# Patient Record
Sex: Female | Born: 1955
Health system: Southern US, Community
[De-identification: ages and names within clinical notes are randomized; demographics above are authoritative.]

## PROBLEM LIST (undated history)

## (undated) DIAGNOSIS — H269 Unspecified cataract: Secondary | ICD-10-CM

## (undated) DIAGNOSIS — T7840XA Allergy, unspecified, initial encounter: Secondary | ICD-10-CM

## (undated) DIAGNOSIS — F419 Anxiety disorder, unspecified: Secondary | ICD-10-CM

## (undated) HISTORY — DX: Allergy, unspecified, initial encounter: T78.40XA

## (undated) HISTORY — PX: BACK SURGERY: SHX140

## (undated) HISTORY — DX: Unspecified cataract: H26.9

## (undated) HISTORY — DX: Anxiety disorder, unspecified: F41.9

## (undated) HISTORY — PX: OTHER SURGICAL HISTORY: SHX169

---

## 1998-03-09 ENCOUNTER — Other Ambulatory Visit: Admission: RE | Admit: 1998-03-09 | Discharge: 1998-03-09 | Payer: Self-pay | Admitting: Family Medicine

## 1998-07-29 ENCOUNTER — Ambulatory Visit (HOSPITAL_COMMUNITY): Admission: RE | Admit: 1998-07-29 | Discharge: 1998-07-29 | Payer: Self-pay | Admitting: Obstetrics and Gynecology

## 2001-08-14 ENCOUNTER — Other Ambulatory Visit: Admission: RE | Admit: 2001-08-14 | Discharge: 2001-08-14 | Payer: Self-pay | Admitting: Obstetrics and Gynecology

## 2003-08-11 ENCOUNTER — Other Ambulatory Visit: Admission: RE | Admit: 2003-08-11 | Discharge: 2003-08-11 | Payer: Self-pay | Admitting: Obstetrics and Gynecology

## 2005-04-18 ENCOUNTER — Other Ambulatory Visit: Admission: RE | Admit: 2005-04-18 | Discharge: 2005-04-18 | Payer: Self-pay | Admitting: Obstetrics and Gynecology

## 2007-02-14 ENCOUNTER — Ambulatory Visit: Payer: Self-pay | Admitting: Gastroenterology

## 2007-02-28 ENCOUNTER — Encounter (INDEPENDENT_AMBULATORY_CARE_PROVIDER_SITE_OTHER): Payer: Self-pay | Admitting: Specialist

## 2007-02-28 ENCOUNTER — Ambulatory Visit: Payer: Self-pay | Admitting: Gastroenterology

## 2007-04-07 ENCOUNTER — Ambulatory Visit: Payer: Self-pay | Admitting: Gastroenterology

## 2007-05-21 ENCOUNTER — Encounter: Admission: RE | Admit: 2007-05-21 | Discharge: 2007-05-21 | Payer: Self-pay | Admitting: Family Medicine

## 2007-06-07 ENCOUNTER — Encounter: Admission: RE | Admit: 2007-06-07 | Discharge: 2007-06-07 | Payer: Self-pay | Admitting: Family Medicine

## 2010-12-06 ENCOUNTER — Encounter
Admission: RE | Admit: 2010-12-06 | Discharge: 2010-12-06 | Payer: Self-pay | Source: Home / Self Care | Attending: Obstetrics and Gynecology | Admitting: Obstetrics and Gynecology

## 2010-12-14 ENCOUNTER — Encounter
Admission: RE | Admit: 2010-12-14 | Discharge: 2010-12-14 | Payer: Self-pay | Source: Home / Self Care | Attending: Gastroenterology | Admitting: Gastroenterology

## 2011-04-20 NOTE — Assessment & Plan Note (Signed)
[facility]                         GASTROENTEROLOGY OFFICE NOTE   Angie Elliott, Angie Elliott                   MRN:          [id number]  DATE:04/07/2007                            DOB:          01/15/1956    PRIMARY CARE PHYSICIAN:  L. Lupe Carney, M.D.   REASON FOR VISIT:  Intermittent diarrhea.   HISTORY OF PRESENT ILLNESS:  Angie Elliott is a very pleasant 55-year-  old woman whom I met at the time of routine colorectal cancer screening  colonoscopy last month.  At the time of the colonoscopy, during routine  questioning, she admitted to having intermittent bouts of diarrhea that  could last 3-4 weeks at a time, moving her bowels 3-4 times a day during  those bouts.  She will have some cramping at the same time.  The  diarrhea has always been nonbloody.  During her first episode, it lasted  approximately a month or so.  She took Lomotil, and that seemed to help.  More recently, she has been taking Imodium, and that also helps, when  she gets around to finally taking it.  She says these bouts have been  once or twice a year and have been going on for about three years.  She  cannot cite any specific aggravating factor.   Her colonoscopy was essentially normal.  Her terminal ileum was normal.  I did random biopsies of her colonic mucosa, which interestingly showed  slight increase in intraepithelial lymphocytes, which can be  suggestive of microscopic colitis.  Between the episodes of diarrhea,  she has one bowel movement a day.   REVIEW OF SYSTEMS:  Notable for stable weight, otherwise essentially  normal and is available on the nursing intake sheet.   PAST MEDICAL HISTORY:  Tubal ligation 19 years ago.   CURRENT MEDICATIONS:  Birth control pills, Ambien, Benadryl Allergy  medicine.   ALLERGIES:  No known drug allergies.   SOCIAL HISTORY:  Married with two sons.  Lives with her husband.  Drinks  three glasses of wine a month.  Drinks 1-2  teas a day.  No other  caffeine.   FAMILY HISTORY:  No colon cancer or colon polyps are noted.   PHYSICAL EXAMINATION:  VITAL SIGNS:  Height 5 feet 10 inches.  Weight  177 pounds.  Blood pressure 120/76, pulse 76.  CONSTITUTIONAL:  Generally well-appearing.  NEUROLOGIC:  Alert and oriented x3.  HEENT:  Eyes:  Extraocular movements are intact.  Mouth:  Oropharynx  moist.  No lesions.  NECK:  Supple.  No lymphadenopathy.  LUNGS:  Clear to auscultation bilaterally.  CARDIOVASCULAR:  Heart has a regular rate and rhythm.  ABDOMEN:  Soft, nontender, nondistended.  Normal bowel sounds.  EXTREMITIES:  No lower extremity edema.  SKIN:  No rash or lesions on visible extremities.   ASSESSMENT/PLAN:  A 55 year old woman with intermittent diarrhea.  She  may have mild microscopic colitis. Certainly, the random colon biopsies  were somewhat abnormal.  Fortunately, for the most part, she has one  bowel movement a day.  I recommended that during her next episode of  loose stools, to take  Imodium, one pill in the morning and one in the  early afternoon.  If that works, then she should just continue that  until the episode runs it course.  If two Imodium a day do not help, I  instructed her to call my office, and I would try her on either  increased dose of Imodium or more likely, budesonide, which is a poorly  absorbed steroid and showed to work very effectively with microscopic  colitis.  She understands these recommendations and will follow up on an  as-needed basis.     Rachael Fee, MD  Electronically Signed    DPJ/MedQ  DD: 04/07/2007  DT: 04/08/2007  Job #: [id number]   cc:   L. Lupe Carney, M.D.

## 2014-03-03 ENCOUNTER — Other Ambulatory Visit: Payer: Self-pay | Admitting: Obstetrics and Gynecology

## 2014-07-29 ENCOUNTER — Other Ambulatory Visit: Payer: Self-pay | Admitting: Family Medicine

## 2014-07-29 ENCOUNTER — Ambulatory Visit
Admission: RE | Admit: 2014-07-29 | Discharge: 2014-07-29 | Disposition: A | Discharge status: Home / Self Care | Payer: 59 | Source: Ambulatory Visit | Attending: Family Medicine | Admitting: Family Medicine

## 2014-07-29 DIAGNOSIS — M25561 Pain in right knee: Secondary | ICD-10-CM

## 2015-03-11 ENCOUNTER — Other Ambulatory Visit: Payer: Self-pay | Admitting: Obstetrics and Gynecology

## 2015-03-14 LAB — CYTOLOGY - PAP

## 2016-03-15 ENCOUNTER — Other Ambulatory Visit: Payer: Self-pay | Admitting: Obstetrics and Gynecology

## 2016-03-15 DIAGNOSIS — R928 Other abnormal and inconclusive findings on diagnostic imaging of breast: Secondary | ICD-10-CM

## 2016-03-19 ENCOUNTER — Ambulatory Visit
Admission: RE | Admit: 2016-03-19 | Discharge: 2016-03-19 | Disposition: A | Discharge status: Home / Self Care | Payer: Commercial Managed Care - PPO | Source: Ambulatory Visit | Attending: Obstetrics and Gynecology | Admitting: Obstetrics and Gynecology

## 2016-03-19 ENCOUNTER — Other Ambulatory Visit: Payer: Self-pay | Admitting: Obstetrics and Gynecology

## 2016-03-19 DIAGNOSIS — N63 Unspecified lump in unspecified breast: Secondary | ICD-10-CM

## 2016-03-19 DIAGNOSIS — R928 Other abnormal and inconclusive findings on diagnostic imaging of breast: Secondary | ICD-10-CM

## 2016-12-17 ENCOUNTER — Encounter: Payer: Self-pay | Admitting: Gastroenterology

## 2017-04-16 DIAGNOSIS — Z01419 Encounter for gynecological examination (general) (routine) without abnormal findings: Secondary | ICD-10-CM | POA: Diagnosis not present

## 2017-04-16 DIAGNOSIS — Z0000 Encounter for general adult medical examination without abnormal findings: Secondary | ICD-10-CM | POA: Diagnosis not present

## 2017-05-02 DIAGNOSIS — Z1231 Encounter for screening mammogram for malignant neoplasm of breast: Secondary | ICD-10-CM | POA: Diagnosis not present

## 2017-05-08 ENCOUNTER — Encounter: Payer: Self-pay | Admitting: Gastroenterology

## 2017-06-12 DIAGNOSIS — H43811 Vitreous degeneration, right eye: Secondary | ICD-10-CM | POA: Diagnosis not present

## 2017-06-12 DIAGNOSIS — H35051 Retinal neovascularization, unspecified, right eye: Secondary | ICD-10-CM | POA: Diagnosis not present

## 2017-06-19 DIAGNOSIS — H35721 Serous detachment of retinal pigment epithelium, right eye: Secondary | ICD-10-CM | POA: Diagnosis not present

## 2017-06-19 DIAGNOSIS — H3561 Retinal hemorrhage, right eye: Secondary | ICD-10-CM | POA: Diagnosis not present

## 2017-06-19 DIAGNOSIS — H353211 Exudative age-related macular degeneration, right eye, with active choroidal neovascularization: Secondary | ICD-10-CM | POA: Diagnosis not present

## 2017-06-19 DIAGNOSIS — H2511 Age-related nuclear cataract, right eye: Secondary | ICD-10-CM | POA: Diagnosis not present

## 2017-06-20 ENCOUNTER — Other Ambulatory Visit (HOSPITAL_COMMUNITY): Payer: Self-pay | Admitting: Ophthalmology

## 2017-06-20 DIAGNOSIS — H35011 Changes in retinal vascular appearance, right eye: Secondary | ICD-10-CM | POA: Diagnosis not present

## 2017-06-20 DIAGNOSIS — H35731 Hemorrhagic detachment of retinal pigment epithelium, right eye: Secondary | ICD-10-CM | POA: Diagnosis not present

## 2017-06-20 DIAGNOSIS — H3561 Retinal hemorrhage, right eye: Secondary | ICD-10-CM | POA: Diagnosis not present

## 2017-06-20 DIAGNOSIS — R51 Headache: Secondary | ICD-10-CM

## 2017-06-20 DIAGNOSIS — R519 Headache, unspecified: Secondary | ICD-10-CM

## 2017-06-20 DIAGNOSIS — G44021 Chronic cluster headache, intractable: Secondary | ICD-10-CM | POA: Diagnosis not present

## 2017-06-21 DIAGNOSIS — H3560 Retinal hemorrhage, unspecified eye: Secondary | ICD-10-CM | POA: Diagnosis not present

## 2017-06-24 ENCOUNTER — Encounter (HOSPITAL_COMMUNITY): Payer: Self-pay

## 2017-06-24 ENCOUNTER — Ambulatory Visit (HOSPITAL_COMMUNITY)
Admission: RE | Admit: 2017-06-24 | Discharge: 2017-06-24 | Disposition: A | Discharge status: Home / Self Care | Payer: Commercial Managed Care - PPO | Source: Ambulatory Visit | Attending: Ophthalmology | Admitting: Ophthalmology

## 2017-06-24 DIAGNOSIS — H532 Diplopia: Secondary | ICD-10-CM | POA: Diagnosis not present

## 2017-06-24 DIAGNOSIS — R51 Headache: Secondary | ICD-10-CM | POA: Diagnosis not present

## 2017-06-24 DIAGNOSIS — H35011 Changes in retinal vascular appearance, right eye: Secondary | ICD-10-CM

## 2017-06-24 DIAGNOSIS — R519 Headache, unspecified: Secondary | ICD-10-CM

## 2017-06-24 DIAGNOSIS — H538 Other visual disturbances: Secondary | ICD-10-CM | POA: Diagnosis not present

## 2017-06-24 LAB — POCT I-STAT CREATININE: Creatinine, Ser: 0.9 mg/dL (ref 0.44–1.00)

## 2017-06-24 MED ORDER — IOPAMIDOL (ISOVUE-300) INJECTION 61%
INTRAVENOUS | Status: AC
  Administered 2017-06-24: 100 mL
  Filled 2017-06-24: qty 100

## 2017-06-28 ENCOUNTER — Ambulatory Visit (AMBULATORY_SURGERY_CENTER): Payer: Self-pay

## 2017-06-28 VITALS — Ht 70.0 in | Wt 160.0 lb

## 2017-06-28 DIAGNOSIS — Z1211 Encounter for screening for malignant neoplasm of colon: Secondary | ICD-10-CM

## 2017-06-28 MED ORDER — NA SULFATE-K SULFATE-MG SULF 17.5-3.13-1.6 GM/177ML PO SOLN: 1.0000 | Freq: Once | ORAL | 0 refills | Status: AC

## 2017-06-28 NOTE — Progress Notes (Signed)
Denies allergies to eggs or soy products. Denies complication of anesthesia or sedation. Denies use of weight loss medication. Denies use of O2.   Emmi instructions declined.  

## 2017-07-02 ENCOUNTER — Encounter: Payer: Self-pay | Admitting: Gastroenterology

## 2017-07-05 DIAGNOSIS — H3561 Retinal hemorrhage, right eye: Secondary | ICD-10-CM | POA: Diagnosis not present

## 2017-07-05 DIAGNOSIS — H35059 Retinal neovascularization, unspecified, unspecified eye: Secondary | ICD-10-CM | POA: Diagnosis not present

## 2017-07-05 DIAGNOSIS — H2513 Age-related nuclear cataract, bilateral: Secondary | ICD-10-CM | POA: Diagnosis not present

## 2017-07-06 DIAGNOSIS — H3561 Retinal hemorrhage, right eye: Secondary | ICD-10-CM | POA: Insufficient documentation

## 2017-07-06 DIAGNOSIS — H318 Other specified disorders of choroid: Secondary | ICD-10-CM | POA: Insufficient documentation

## 2017-07-06 DIAGNOSIS — H2513 Age-related nuclear cataract, bilateral: Secondary | ICD-10-CM | POA: Insufficient documentation

## 2017-07-12 ENCOUNTER — Encounter: Payer: Self-pay | Admitting: Gastroenterology

## 2017-07-12 ENCOUNTER — Ambulatory Visit (AMBULATORY_SURGERY_CENTER): Payer: Commercial Managed Care - PPO | Admitting: Gastroenterology

## 2017-07-12 VITALS — BP 116/63 | HR 73 | Temp 98.2°F | Resp 18 | Ht 70.0 in | Wt 160.0 lb

## 2017-07-12 DIAGNOSIS — Z1211 Encounter for screening for malignant neoplasm of colon: Secondary | ICD-10-CM | POA: Diagnosis not present

## 2017-07-12 DIAGNOSIS — Z1212 Encounter for screening for malignant neoplasm of rectum: Secondary | ICD-10-CM

## 2017-07-12 MED ORDER — SODIUM CHLORIDE 0.9 % IV SOLN: 500.0000 mL | INTRAVENOUS | Status: DC

## 2017-07-12 NOTE — Progress Notes (Signed)
A and O x3. Report to RN. Tolerated MAC anesthesia well.

## 2017-07-12 NOTE — Patient Instructions (Signed)
YOU HAD AN ENDOSCOPIC PROCEDURE TODAY AT [facility]:   Refer to the procedure report that was given to you for any specific questions about what was found during the examination.  If the procedure report does not answer your questions, please call your gastroenterologist to clarify.  If you requested that your care partner not be given the details of your procedure findings, then the procedure report has been included in a sealed envelope for you to review at your convenience later.  YOU SHOULD EXPECT: Some feelings of bloating in the abdomen. Passage of more gas than usual.  Walking can help get rid of the air that was put into your GI tract during the procedure and reduce the bloating. If you had a lower endoscopy (such as a colonoscopy or flexible sigmoidoscopy) you may notice spotting of blood in your stool or on the toilet paper. If you underwent a bowel prep for your procedure, you may not have a normal bowel movement for a few days.  Please Note:  You might notice some irritation and congestion in your nose or some drainage.  This is from the oxygen used during your procedure.  There is no need for concern and it should clear up in a day or so.  SYMPTOMS TO REPORT IMMEDIATELY:   Following lower endoscopy (colonoscopy or flexible sigmoidoscopy):  Excessive amounts of blood in the stool  Significant tenderness or worsening of abdominal pains  Swelling of the abdomen that is new, acute  Fever of 100F or higher  For urgent or emergent issues, a gastroenterologist can be reached at any hour by calling [phone].   DIET:  We do recommend a small meal at first, but then you may proceed to your regular diet.  Drink plenty of fluids but you should avoid alcoholic beverages for 24 hours.  ACTIVITY:  You should plan to take it easy for the rest of today and you should NOT DRIVE or use heavy machinery until tomorrow (because of the sedation medicines used during the test).     FOLLOW UP: Our staff will call the number listed on your records the next business day following your procedure to check on you and address any questions or concerns that you may have regarding the information given to you following your procedure. If we do not reach you, we will leave a message.  However, if you are feeling well and you are not experiencing any problems, there is no need to return our call.  We will assume that you have returned to your regular daily activities without incident.  If any biopsies were taken you will be contacted by phone or by letter within the next 1-3 weeks.  Please call us at [phone] if you have not heard about the biopsies in 3 weeks.   Repeat Colonoscopy in 10 years   SIGNATURES/CONFIDENTIALITY: You and/or your care partner have signed paperwork which will be entered into your electronic medical record.  These signatures attest to the fact that that the information above on your After Visit Summary has been reviewed and is understood.  Full responsibility of the confidentiality of this discharge information lies with you and/or your care-partner.

## 2017-07-12 NOTE — Progress Notes (Signed)
Pt's states no medical or surgical changes since previsit or office visit. 

## 2017-07-12 NOTE — Op Note (Signed)
[place] Patient Name: Angie Elliott Procedure Date: 07/12/2017 9:55 AM MRN: [id number] Endoscopist: Milus Banister , MD Age: 61 Referring MD:  Date of Birth: [birth year] Gender: Female Account #: [id number] Procedure:                Colonoscopy Indications:              Screening for colorectal malignant neoplasm Medicines:                Monitored Anesthesia Care Procedure:                Pre-Anesthesia Assessment:                           - Prior to the procedure, a History and Physical                            was performed, and patient medications and                            allergies were reviewed. The patient's tolerance of                            previous anesthesia was also reviewed. The risks                            and benefits of the procedure and the sedation                            options and risks were discussed with the patient.                            All questions were answered, and informed consent                            was obtained. Prior Anticoagulants: The patient has                            taken no previous anticoagulant or antiplatelet                            agents. ASA Grade Assessment: II - A patient with                            mild systemic disease. After reviewing the risks                            and benefits, the patient was deemed in                            satisfactory condition to undergo the procedure.                           After obtaining informed consent, the colonoscope  was passed under direct vision. Throughout the                            procedure, the patient's blood pressure, pulse, and                            oxygen saturations were monitored continuously. The                            Model CF-HQ190L [id number]) scope was introduced                            through the anus and advanced to the the cecum,                            identified by  appendiceal orifice and ileocecal                            valve. The colonoscopy was performed without                            difficulty. The patient tolerated the procedure                            well. The quality of the bowel preparation was                            excellent. The ileocecal valve, appendiceal                            orifice, and rectum were photographed. Scope In: 10:08:28 AM Scope Out: 10:18:45 AM Scope Withdrawal Time: 0 hours 6 minutes 24 seconds  Total Procedure Duration: 0 hours 10 minutes 17 seconds  Findings:                 The entire examined colon appeared normal on direct                            and retroflexion views. Complications:            No immediate complications. Estimated blood loss:                            None. Estimated Blood Loss:     Estimated blood loss: none. Impression:               - The entire examined colon is normal on direct and                            retroflexion views.                           - No polyps or cancers. Recommendation:           - Patient has a contact number available for  emergencies. The signs and symptoms of potential                            delayed complications were discussed with the                            patient. Return to normal activities tomorrow.                            Written discharge instructions were provided to the                            patient.                           - Resume previous diet.                           - Continue present medications.                           - Repeat colonoscopy in 10 years for screening. Milus Banister, MD 07/12/2017 10:20:26 AM This report has been signed electronically.

## 2017-07-15 ENCOUNTER — Telehealth: Payer: Self-pay | Admitting: *Deleted

## 2017-07-15 NOTE — Telephone Encounter (Signed)
  Follow up Call-  Call back number 07/12/2017  Post procedure Call Back phone  # [phone]  Permission to leave phone message Yes  Some recent data might be hidden     Patient questions:  Do you have a fever, pain , or abdominal swelling? No. Pain Score  0 *  Have you tolerated food without any problems? Yes.    Have you been able to return to your normal activities? Yes.    Do you have any questions about your discharge instructions: Diet   No. Medications  No. Follow up visit  No.  Do you have questions or concerns about your Care? No.  Actions: * If pain score is 4 or above: No action needed, pain <4.

## 2017-08-06 DIAGNOSIS — H3561 Retinal hemorrhage, right eye: Secondary | ICD-10-CM | POA: Diagnosis not present

## 2017-08-06 DIAGNOSIS — H35051 Retinal neovascularization, unspecified, right eye: Secondary | ICD-10-CM | POA: Diagnosis not present

## 2017-08-06 DIAGNOSIS — H35059 Retinal neovascularization, unspecified, unspecified eye: Secondary | ICD-10-CM | POA: Diagnosis not present

## 2017-08-23 DIAGNOSIS — R300 Dysuria: Secondary | ICD-10-CM | POA: Diagnosis not present

## 2017-08-23 DIAGNOSIS — Z23 Encounter for immunization: Secondary | ICD-10-CM | POA: Diagnosis not present

## 2017-08-29 DIAGNOSIS — R300 Dysuria: Secondary | ICD-10-CM | POA: Diagnosis not present

## 2017-08-29 DIAGNOSIS — R8290 Unspecified abnormal findings in urine: Secondary | ICD-10-CM | POA: Diagnosis not present

## 2017-09-03 DIAGNOSIS — H3561 Retinal hemorrhage, right eye: Secondary | ICD-10-CM | POA: Diagnosis not present

## 2017-09-03 DIAGNOSIS — H318 Other specified disorders of choroid: Secondary | ICD-10-CM | POA: Diagnosis not present

## 2017-10-04 DIAGNOSIS — H3561 Retinal hemorrhage, right eye: Secondary | ICD-10-CM | POA: Diagnosis not present

## 2017-10-04 DIAGNOSIS — H318 Other specified disorders of choroid: Secondary | ICD-10-CM | POA: Diagnosis not present

## 2017-10-04 DIAGNOSIS — H35051 Retinal neovascularization, unspecified, right eye: Secondary | ICD-10-CM | POA: Diagnosis not present

## 2017-11-15 DIAGNOSIS — H3561 Retinal hemorrhage, right eye: Secondary | ICD-10-CM | POA: Diagnosis not present

## 2017-11-15 DIAGNOSIS — H318 Other specified disorders of choroid: Secondary | ICD-10-CM | POA: Diagnosis not present

## 2017-12-27 DIAGNOSIS — H318 Other specified disorders of choroid: Secondary | ICD-10-CM | POA: Diagnosis not present

## 2018-02-21 DIAGNOSIS — H318 Other specified disorders of choroid: Secondary | ICD-10-CM | POA: Diagnosis not present

## 2018-02-21 DIAGNOSIS — H3561 Retinal hemorrhage, right eye: Secondary | ICD-10-CM | POA: Diagnosis not present

## 2018-02-21 DIAGNOSIS — H2513 Age-related nuclear cataract, bilateral: Secondary | ICD-10-CM | POA: Diagnosis not present

## 2018-03-14 DIAGNOSIS — R300 Dysuria: Secondary | ICD-10-CM | POA: Diagnosis not present

## 2018-03-14 DIAGNOSIS — N3000 Acute cystitis without hematuria: Secondary | ICD-10-CM | POA: Diagnosis not present

## 2018-04-29 ENCOUNTER — Other Ambulatory Visit: Payer: Self-pay

## 2018-04-29 ENCOUNTER — Ambulatory Visit (INDEPENDENT_AMBULATORY_CARE_PROVIDER_SITE_OTHER): Payer: Commercial Managed Care - PPO | Admitting: Podiatry

## 2018-04-29 ENCOUNTER — Encounter: Payer: Self-pay | Admitting: Podiatry

## 2018-04-29 ENCOUNTER — Ambulatory Visit (INDEPENDENT_AMBULATORY_CARE_PROVIDER_SITE_OTHER): Payer: Commercial Managed Care - PPO

## 2018-04-29 DIAGNOSIS — S92415A Nondisplaced fracture of proximal phalanx of left great toe, initial encounter for closed fracture: Secondary | ICD-10-CM

## 2018-04-29 DIAGNOSIS — M779 Enthesopathy, unspecified: Secondary | ICD-10-CM

## 2018-04-29 DIAGNOSIS — M778 Other enthesopathies, not elsewhere classified: Secondary | ICD-10-CM

## 2018-04-29 NOTE — Progress Notes (Signed)
Subjective:  Patient ID: Angie Elliott, female    DOB: 1955-12-22,  MRN: [id number] HPI Chief Complaint  Patient presents with  . Toe Pain    left foot great toe; pt stated, "stepped over ottoman on saturday night, got big toe stuck in pajamas and fell, toe bent back and been sore and tender to touch every since; hard to move all toes, been icing and taking tylenol for pain; feels warm when i touch it and gets sharp pain; pain is usally (8/10) when walking"  . Foot Pain    left foot bottom of heel; pt stated, "would like heel looked at, stepped on rock about 2-3 wks ago and spot is still little sore and painful to walk on; have plantar fasciitis in both feet"    62 y.o. female presents with the above complaint.   ROS: Denies fever chills nausea vomiting muscle aches pains calf pain chest pain back pain shortness of breath.  Past Medical History:  Diagnosis Date  . Allergy   . Anxiety   . Cataract    Past Surgical History:  Procedure Laterality Date  . BACK SURGERY N/A   . knee surgery x 2 Bilateral     Current Outpatient Medications:  .  acetaminophen (TYLENOL) 500 MG tablet, Take 500 mg by mouth every 6 (six) hours as needed., Disp: , Rfl:  .  OVER THE COUNTER MEDICATION, Fiber Gummies 4 g  Two tablets daily., Disp: , Rfl:  .  sulfamethoxazole-trimethoprim (BACTRIM DS,SEPTRA DS) 800-160 MG tablet, , Disp: , Rfl: 0  Current Facility-Administered Medications:  .  0.9 %  sodium chloride infusion, 500 mL, Intravenous, Continuous, Milus Banister, MD  No Known Allergies Review of Systems Objective:  There were no vitals filed for this visit.  General: Well developed, nourished, in no acute distress, alert and oriented x3   Dermatological: Skin is warm, dry and supple bilateral. Nails x 10 are well maintained; remaining integument appears unremarkable at this time. There are no open sores, no preulcerative lesions, no rash or signs of infection present.  Vascular:  Dorsalis Pedis artery and Posterior Tibial artery pedal pulses are 2/4 bilateral with immedate capillary fill time. Pedal hair growth present. No varicosities and no lower extremity edema present bilateral.   Neruologic: Grossly intact via light touch bilateral. Vibratory intact via tuning fork bilateral. Protective threshold with Semmes Wienstein monofilament intact to all pedal sites bilateral. Patellar and Achilles deep tendon reflexes 2+ bilateral. No Babinski or clonus noted bilateral.   Musculoskeletal: No gross boney pedal deformities bilateral. No pain, crepitus, or limitation noted with foot and ankle range of motion bilateral. Muscular strength 5/5 in all groups tested bilateral.  Dislocated painful edematous distal portion of the proximal phalanx hallux left exquisitely tender on palpation extensors and flexors appear to be intact.  Gait: Unassisted, Nonantalgic.    Radiographs:  Radiographs taken today demonstrates an osseously mature individual soft tissue increase in density plantar fascial cranial insertion site consistent with plantar fasciitis acute findings also demonstrate a fracture of the distalmost aspect of the proximal phalanx dorsally dislocated laterally dislocated does not appear to be fracture through the articular surface.  Assessment & Plan:    Assessment: Closed dislocated fracture of the distal phalanx at the level of the hallux interphalangeal joint.  Plan: Discussed etiology pathology conservative versus surgical therapies.  At this point we consented her for a closed reduction possible open reduction with pinning of the toe.  She understands this and is  amenable to it.  Discussed the possible postop complications which may include but are not limited to postop pain bleeding swelling infection need for further surgery osteoarthritis failure of the procedure to alleviate symptoms also digit loss of limb loss of life.  Dispensed a Cam walker for her to start wearing  immediately.     Max T. Mayo, [place]

## 2018-04-29 NOTE — Patient Instructions (Signed)
Pre-Operative Instructions  Congratulations, you have decided to take an important step towards improving your quality of life.  You can be assured that the doctors and staff at [facility] will be with you every step of the way.  Here are some important things you should know:  1. Plan to be at the surgery center/hospital at least 1 (one) hour prior to your scheduled time, unless otherwise directed by the surgical center/hospital staff.  You must have a responsible adult accompany you, remain during the surgery and drive you home.  Make sure you have directions to the surgical center/hospital to ensure you arrive on time. 2. If you are having surgery at [facility] or [facility], you will need a copy of your medical history and physical form from your family physician within one month prior to the date of surgery. We will give you a form for your primary physician to complete.  3. We make every effort to accommodate the date you request for surgery.  However, there are times where surgery dates or times have to be moved.  We will contact you as soon as possible if a change in schedule is required.   4. No aspirin/ibuprofen for one week before surgery.  If you are on aspirin, any non-steroidal anti-inflammatory medications (Mobic, Aleve, Ibuprofen) should not be taken seven (7) days prior to your surgery.  You make take Tylenol for pain prior to surgery.  5. Medications - If you are taking daily heart and blood pressure medications, seizure, reflux, allergy, asthma, anxiety, pain or diabetes medications, make sure you notify the surgery center/hospital before the day of surgery so they can tell you which medications you should take or avoid the day of surgery. 6. No food or drink after midnight the night before surgery unless directed otherwise by surgical center/hospital staff. 7. No alcoholic beverages 24-hours prior to surgery.  No smoking 24-hours prior or 24-hours after  surgery. 8. Wear loose pants or shorts. They should be loose enough to fit over bandages, boots, and casts. 9. Don't wear slip-on shoes. Sneakers are preferred. 10. Bring your boot with you to the surgery center/hospital.  Also bring crutches or a walker if your physician has prescribed it for you.  If you do not have this equipment, it will be provided for you after surgery. 11. If you have not been contacted by the surgery center/hospital by the day before your surgery, call to confirm the date and time of your surgery. 12. Leave-time from work may vary depending on the type of surgery you have.  Appropriate arrangements should be made prior to surgery with your employer. 13. Prescriptions will be provided immediately following surgery by your doctor.  Fill these as soon as possible after surgery and take the medication as directed. Pain medications will not be refilled on weekends and must be approved by the doctor. 14. Remove nail polish on the operative foot and avoid getting pedicures prior to surgery. 15. Wash the night before surgery.  The night before surgery wash the foot and leg well with water and the antibacterial soap provided. Be sure to pay special attention to beneath the toenails and in between the toes.  Wash for at least three (3) minutes. Rinse thoroughly with water and dry well with a towel.  Perform this wash unless told not to do so by your physician.  Enclosed: 1 Ice pack (please put in freezer the night before surgery)   1 Hibiclens skin cleaner     Pre-op instructions  If you have any questions regarding the instructions, please do not hesitate to call our office.  [place]: [address], [place], [place] [address] -- [phone]  [place]: [address] [place], [place] [address] -- [phone]  [place]: [address]  [place], [place] [address] -- [phone]  [place]: [address], [address], [place], [place] [address] -- [phone]  Website:  [email or web address] 

## 2018-05-02 ENCOUNTER — Encounter: Payer: Self-pay | Admitting: *Deleted

## 2018-05-02 ENCOUNTER — Telehealth: Payer: Self-pay | Admitting: Podiatry

## 2018-05-02 DIAGNOSIS — H318 Other specified disorders of choroid: Secondary | ICD-10-CM | POA: Diagnosis not present

## 2018-05-02 NOTE — Telephone Encounter (Signed)
Paitent was told by Dr Milinda Pointer he could write her a note to get out of jury duty on 05/06/18. If so if you can email it to her this is her email below If you can call patient back at [phone]    [email or web address]

## 2018-05-02 NOTE — Telephone Encounter (Signed)
Letter emailed to pt by Bethann Humble - Record coordinator.

## 2018-05-04 NOTE — Telephone Encounter (Signed)
That is fine.  They may still not let her out of jury duty.

## 2018-05-07 ENCOUNTER — Other Ambulatory Visit: Payer: Self-pay | Admitting: Podiatry

## 2018-05-07 MED ORDER — OXYCODONE-ACETAMINOPHEN 10-325 MG PO TABS: 1.0000 | ORAL_TABLET | Freq: Four times a day (QID) | ORAL | 0 refills | Status: AC | PRN

## 2018-05-07 MED ORDER — ONDANSETRON HCL 4 MG PO TABS: 4.0000 mg | ORAL_TABLET | Freq: Three times a day (TID) | ORAL | 0 refills | Status: DC | PRN

## 2018-05-07 MED ORDER — CEPHALEXIN 500 MG PO CAPS: 500.0000 mg | ORAL_CAPSULE | Freq: Three times a day (TID) | ORAL | 0 refills | Status: DC

## 2018-05-08 HISTORY — PX: DG GREAT TOE LEFT FOOT: HXRAD1656

## 2018-05-09 ENCOUNTER — Encounter: Payer: Self-pay | Admitting: Podiatry

## 2018-05-09 ENCOUNTER — Telehealth: Payer: Self-pay | Admitting: Podiatry

## 2018-05-09 DIAGNOSIS — S92412A Displaced fracture of proximal phalanx of left great toe, initial encounter for closed fracture: Secondary | ICD-10-CM | POA: Diagnosis not present

## 2018-05-09 DIAGNOSIS — S92415A Nondisplaced fracture of proximal phalanx of left great toe, initial encounter for closed fracture: Secondary | ICD-10-CM | POA: Diagnosis not present

## 2018-05-09 DIAGNOSIS — M545 Low back pain: Secondary | ICD-10-CM | POA: Diagnosis not present

## 2018-05-09 DIAGNOSIS — M25572 Pain in left ankle and joints of left foot: Secondary | ICD-10-CM | POA: Diagnosis not present

## 2018-05-09 DIAGNOSIS — M722 Plantar fascial fibromatosis: Secondary | ICD-10-CM | POA: Diagnosis not present

## 2018-05-09 DIAGNOSIS — S92412B Displaced fracture of proximal phalanx of left great toe, initial encounter for open fracture: Secondary | ICD-10-CM | POA: Diagnosis not present

## 2018-05-09 NOTE — Telephone Encounter (Signed)
This is Alise with [employer] on [address]. Dr. Milinda Pointer wrote a prescription for the percocet 10-325 with directions right now, it would be 60 morphine milligram equivalent and usually on the first fill we can only fill 50 morphine milligram equivalents or less. We just wondered if we could put on there max of 3 tablets per day. Our phone number is [phone]. Thanks.

## 2018-05-11 ENCOUNTER — Emergency Department (HOSPITAL_COMMUNITY): Payer: Commercial Managed Care - PPO

## 2018-05-11 ENCOUNTER — Other Ambulatory Visit: Payer: Self-pay

## 2018-05-11 ENCOUNTER — Encounter (HOSPITAL_COMMUNITY): Payer: Self-pay | Admitting: Emergency Medicine

## 2018-05-11 ENCOUNTER — Inpatient Hospital Stay (HOSPITAL_COMMUNITY)
Admission: EM | Admit: 2018-05-11 | Discharge: 2018-05-12 | DRG: 282 | Disposition: A | Discharge status: Home / Self Care | Payer: Commercial Managed Care - PPO | Attending: Cardiology | Admitting: Cardiology

## 2018-05-11 DIAGNOSIS — I214 Non-ST elevation (NSTEMI) myocardial infarction: Secondary | ICD-10-CM | POA: Diagnosis not present

## 2018-05-11 DIAGNOSIS — I361 Nonrheumatic tricuspid (valve) insufficiency: Secondary | ICD-10-CM | POA: Diagnosis not present

## 2018-05-11 DIAGNOSIS — I351 Nonrheumatic aortic (valve) insufficiency: Secondary | ICD-10-CM | POA: Diagnosis not present

## 2018-05-11 DIAGNOSIS — N959 Unspecified menopausal and perimenopausal disorder: Secondary | ICD-10-CM | POA: Diagnosis present

## 2018-05-11 DIAGNOSIS — R079 Chest pain, unspecified: Secondary | ICD-10-CM | POA: Diagnosis not present

## 2018-05-11 DIAGNOSIS — R778 Other specified abnormalities of plasma proteins: Secondary | ICD-10-CM

## 2018-05-11 DIAGNOSIS — I200 Unstable angina: Secondary | ICD-10-CM | POA: Diagnosis present

## 2018-05-11 DIAGNOSIS — Z79899 Other long term (current) drug therapy: Secondary | ICD-10-CM

## 2018-05-11 DIAGNOSIS — S92402D Displaced unspecified fracture of left great toe, subsequent encounter for fracture with routine healing: Secondary | ICD-10-CM

## 2018-05-11 DIAGNOSIS — Z8249 Family history of ischemic heart disease and other diseases of the circulatory system: Secondary | ICD-10-CM

## 2018-05-11 DIAGNOSIS — R7989 Other specified abnormal findings of blood chemistry: Secondary | ICD-10-CM

## 2018-05-11 LAB — CBC
HEMATOCRIT: 40.4 % (ref 36.0–46.0)
HEMOGLOBIN: 13.1 g/dL (ref 12.0–15.0)
MCH: 29.4 pg (ref 26.0–34.0)
MCHC: 32.4 g/dL (ref 30.0–36.0)
MCV: 90.8 fL (ref 78.0–100.0)
Platelets: 179 10*3/uL (ref 150–400)
RBC: 4.45 MIL/uL (ref 3.87–5.11)
RDW: 13.4 % (ref 11.5–15.5)
WBC: 6.3 10*3/uL (ref 4.0–10.5)

## 2018-05-11 LAB — I-STAT TROPONIN, ED: Troponin i, poc: 1.27 ng/mL (ref 0.00–0.08)

## 2018-05-11 LAB — BASIC METABOLIC PANEL
ANION GAP: 8 (ref 5–15)
BUN: 15 mg/dL (ref 6–20)
CO2: 26 mmol/L (ref 22–32)
Calcium: 9.2 mg/dL (ref 8.9–10.3)
Chloride: 102 mmol/L (ref 101–111)
Creatinine, Ser: 0.9 mg/dL (ref 0.44–1.00)
GFR calc Af Amer: >60 mL/min (ref >60)
GFR calc non Af Amer: >60 mL/min (ref >60)
GLUCOSE: 134 mg/dL — AB (ref 65–99)
POTASSIUM: 4.7 mmol/L (ref 3.5–5.1)
Sodium: 136 mmol/L (ref 135–145)

## 2018-05-11 MED ORDER — HEPARIN BOLUS VIA INFUSION
4000.0000 [IU] | Freq: Once | INTRAVENOUS | Status: AC
  Administered 2018-05-12: 4000 [IU] via INTRAVENOUS
  Filled 2018-05-11: qty 4000

## 2018-05-11 MED ORDER — ASPIRIN EC 325 MG PO TBEC
325.0000 mg | DELAYED_RELEASE_TABLET | Freq: Once | ORAL | Status: AC
  Administered 2018-05-11: 325 mg via ORAL
  Filled 2018-05-11: qty 1

## 2018-05-11 MED ORDER — IOPAMIDOL (ISOVUE-370) INJECTION 76%
100.0000 mL | Freq: Once | INTRAVENOUS | Status: AC | PRN
  Administered 2018-05-11: 100 mL via INTRAVENOUS

## 2018-05-11 MED ORDER — IOPAMIDOL (ISOVUE-370) INJECTION 76%
INTRAVENOUS | Status: AC
  Filled 2018-05-11: qty 100

## 2018-05-11 MED ORDER — HEPARIN (PORCINE) IN NACL 100-0.45 UNIT/ML-% IJ SOLN
900.0000 [IU]/h | INTRAMUSCULAR | Status: DC
  Administered 2018-05-12: 900 [IU]/h via INTRAVENOUS
  Filled 2018-05-11: qty 250

## 2018-05-11 NOTE — ED Provider Notes (Addendum)
[facility] EMERGENCY DEPARTMENT Provider Note   CSN: [id number] Arrival date & time: 05/11/18  2002     History   Chief Complaint Chief Complaint  Patient presents with  . Chest Pain    HPI Angie Elliott is a 62 y.o. female with history of anxiety, recent left foot surgery who presents after an episode of chest pain, nausea, and fever chills.  Patient reports her symptoms began after taking Percocet, however she has been taking the Percocet for the past 3 days without this reaction.  She reports her chest pain has improved, however she states that she still has a dull ache on the left side of her chest.  It does not radiate.  She denies any shortness of breath or pleuritic symptoms.  She denies any diaphoresis.  She has no history of cardiac disease.  She denies any new leg pain or swelling.  She reports no change in her foot pain.  She reports she only has pain when her pain medication wears off.  She denies any calf pain or swelling.  She denies any documented fevers at home.  HPI  Past Medical History:  Diagnosis Date  . Allergy   . Anxiety   . Cataract     Patient Active Problem List   Diagnosis Date Noted  . NSTEMI (non-ST elevated myocardial infarction) ([place])   . Age-related nuclear cataract of both eyes 07/06/2017  . 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 Macular subretinal hemorrhage of right eye 07/06/2017    Past Surgical History:  Procedure Laterality Date  . BACK SURGERY N/A   . DG GREAT TOE LEFT FOOT  05/08/2018   pin put in   . knee surgery x 2 Bilateral      OB History   None      Home Medications    Prior to Admission medications   Medication Sig Start Date End Date Taking? Authorizing Provider  acetaminophen (TYLENOL) 500 MG tablet Take 500 mg by mouth every 6 (six) hours as needed for mild pain.    Yes [provider]  cephALEXin (KEFLEX) 500 MG capsule Take 1 capsule (500 mg total) by mouth 3  (three) times daily. 05/07/18  Yes Hyatt, Max T, DPM  docusate sodium (COLACE) 100 MG capsule Take 100-300 mg by mouth daily as needed for mild constipation.   Yes [provider]  ibuprofen (ADVIL,MOTRIN) 800 MG tablet Take 800 mg by mouth every 8 (eight) hours as needed for moderate pain.   Yes [provider]  ondansetron (ZOFRAN) 4 MG tablet Take 1 tablet (4 mg total) by mouth every 8 (eight) hours as needed for nausea or vomiting. 05/07/18  Yes Hyatt, Max T, DPM  oxyCODONE-acetaminophen (PERCOCET) 10-325 MG tablet Take 1 tablet by mouth every 6 (six) hours as needed for up to 7 days for pain. 05/07/18 05/14/18 Yes Hyatt, Max T, DPM    Family History Family History  Problem Relation Age of Onset  . Pancreatic cancer Mother   . Heart failure Father        valvular heart disease  . CAD Father        s/p CABG in 47's  . Colon cancer Neg Hx   . Esophageal cancer Neg Hx   . Rectal cancer Neg Hx   . Stomach cancer Neg Hx     Social History Social History   Tobacco Use  . Smoking status: Never Smoker  . Smokeless tobacco: Never Used  Substance Use Topics  . Alcohol use: Yes    Comment: occasional beer.   . Drug use: No     Allergies   Patient has no known allergies.   Review of Systems Review of Systems  Constitutional: Positive for chills and fever (subjective). Negative for diaphoresis.  HENT: Negative for facial swelling and sore throat.   Respiratory: Negative for shortness of breath.   Cardiovascular: Positive for chest pain.  Gastrointestinal: Positive for nausea. Negative for abdominal pain and vomiting.  Genitourinary: Negative for dysuria.  Musculoskeletal: Negative for back pain.  Skin: Positive for wound. Negative for rash.  Neurological: Negative for headaches.  Psychiatric/Behavioral: The patient is not nervous/anxious.      Physical Exam Updated Vital Signs BP 123/68   Pulse 65   Temp 97.6 F (36.4 C) (Oral)   Resp 13   Ht 5\' 10"   (1.778 m)   Wt 76.7 kg (169 lb)   SpO2 98%   BMI 24.25 kg/m   Physical Exam  Constitutional: She appears well-developed and well-nourished. No distress.  HENT:  Head: Normocephalic and atraumatic.  Mouth/Throat: Oropharynx is clear and moist. No oropharyngeal exudate.  Eyes: Pupils are equal, round, and reactive to light. Conjunctivae are normal. Right eye exhibits no discharge. Left eye exhibits no discharge. No scleral icterus.  Neck: Normal range of motion. Neck supple. No thyromegaly present.  Cardiovascular: Normal rate, regular rhythm, normal heart sounds and intact distal pulses. Exam reveals no gallop and no friction rub.  No murmur heard. Pulmonary/Chest: Effort normal and breath sounds normal. No stridor. No respiratory distress. She has no wheezes. She has no rales. She exhibits no tenderness.  Abdominal: Soft. Bowel sounds are normal. She exhibits no distension. There is no tenderness. There is no rebound and no guarding.  Musculoskeletal: She exhibits no edema.       Right lower leg: Normal. She exhibits no tenderness and no edema.       Left lower leg: Normal. She exhibits no tenderness and no edema.  CAM Walker boot in place on the left foot, post op dressings in place, minimally removed to view well-appearing distal great toe with pin in place, no erythema or drainage at the site  Lymphadenopathy:    She has no cervical adenopathy.  Neurological: She is alert. Coordination normal.  Skin: Skin is warm and dry. No rash noted. She is not diaphoretic. No pallor.  Psychiatric: She has a normal mood and affect.  Nursing note and vitals reviewed.    ED Treatments / Results  Labs (all labs ordered are listed, but only abnormal results are displayed) Labs Reviewed  BASIC METABOLIC PANEL - Abnormal; Notable for the following components:      Result Value   Glucose, Bld 134 (*)    All other components within normal limits  I-STAT TROPONIN, ED - Abnormal; Notable for the  following components:   Troponin i, poc 1.27 (*)    All other components within normal limits  CBC  HEPARIN LEVEL (UNFRACTIONATED)  CBC  HIV ANTIBODY (ROUTINE TESTING)  COMPREHENSIVE METABOLIC PANEL  MAGNESIUM  TSH  TROPONIN I  TROPONIN I  TROPONIN I  HEMOGLOBIN A1C  CBC WITH DIFFERENTIAL/PLATELET  PROTIME-INR  APTT  OCCULT BLOOD X 1 CARD TO LAB, STOOL  BASIC METABOLIC PANEL  LIPID PANEL  CBC    EKG None  Radiology Dg Chest 2 View  Result Date: 05/11/2018 CLINICAL DATA:  Chest pain. EXAM: CHEST - 2 VIEW COMPARISON:  None. FINDINGS:  The cardiomediastinal contours are normal. Lingular subsegmental atelectasis or scarring. Biapical pleuroparenchymal scarring. Pulmonary vasculature is normal. No consolidation, pleural effusion, or pneumothorax. No acute osseous abnormalities are seen. IMPRESSION: Lingular subsegmental atelectasis or scarring. Biapical pleuroparenchymal scarring. No acute pulmonary process. Electronically Signed   By: Jeb Levering M.D.   On: 05/11/2018 21:02   Ct Angio Chest Pe W And/or Wo Contrast  Result Date: 05/11/2018 CLINICAL DATA:  PE suspected, high pretest prob. Chest pain, nausea, fever and chills onset today. Left foot surgery 2 days ago. EXAM: CT ANGIOGRAPHY CHEST WITH CONTRAST TECHNIQUE: Multidetector CT imaging of the chest was performed using the standard protocol during bolus administration of intravenous contrast. Multiplanar CT image reconstructions and MIPs were obtained to evaluate the vascular anatomy. CONTRAST:  138mL ISOVUE-370 IOPAMIDOL (ISOVUE-370) INJECTION 76% COMPARISON:  Radiographs earlier this day. FINDINGS: Cardiovascular: There are no filling defects within the pulmonary arteries to suggest pulmonary embolus. Thoracic aorta is normal in caliber, no periaortic stranding to suggest dissection. The heart is normal in size. Minimal pericardial fluid may be physiologic or small effusion. Mediastinum/Nodes: No enlarged mediastinal or hilar  lymph nodes. The esophagus is decompressed. No visualized thyroid nodule. Lungs/Pleura: Subsegmental atelectasis or scarring in the lingula. Mild biapical pleuroparenchymal scarring. No consolidation or pulmonary edema. No pleural fluid. No pulmonary mass or suspicious nodule. Trachea mainstem bronchi are patent. Upper Abdomen: Hepatic cysts, characterized on 12/14/2010 abdominal MRI. No acute findings. Musculoskeletal: There are no acute or suspicious osseous abnormalities. Review of the MIP images confirms the above findings. IMPRESSION: 1. No pulmonary embolus or acute intrathoracic abnormality. 2. Subsegmental atelectasis or scarring in the lingula, mild biapical pleuroparenchymal scarring. Electronically Signed   By: Jeb Levering M.D.   On: 05/11/2018 21:58    Procedures Procedures (including critical care time)  CRITICAL CARE Performed by: Frederica Kuster   Total critical care time: 35 minutes  Critical care time was exclusive of separately billable procedures and treating other patients.  Critical care was necessary to treat or prevent imminent or life-threatening deterioration.  Critical care was time spent personally by me on the following activities: development of treatment plan with patient and/or surrogate as well as nursing, discussions with consultants, evaluation of patient's response to treatment, examination of patient, obtaining history from patient or surrogate, ordering and performing treatments and interventions, ordering and review of laboratory studies, ordering and review of radiographic studies, pulse oximetry and re-evaluation of patient's condition.   Medications Ordered in ED Medications  heparin ADULT infusion 100 units/mL (25000 units/21mL sodium chloride 0.45%) (900 Units/hr Intravenous New Bag/Given 05/12/18 0001)  acetaminophen (TYLENOL) tablet 500 mg (has no administration in time range)  cephALEXin (KEFLEX) capsule 500 mg (has no administration in time  range)  docusate sodium (COLACE) capsule 100-300 mg (has no administration in time range)  ondansetron (ZOFRAN) tablet 4 mg (has no administration in time range)  aspirin chewable tablet 324 mg (has no administration in time range)    Or  aspirin suppository 300 mg (has no administration in time range)  aspirin EC tablet 81 mg (has no administration in time range)  nitroGLYCERIN (NITROSTAT) SL tablet 0.4 mg (has no administration in time range)  ondansetron (ZOFRAN) injection 4 mg (has no administration in time range)  nitroGLYCERIN 50 mg in dextrose 5 % 250 mL (0.2 mg/mL) infusion (has no administration in time range)  atorvastatin (LIPITOR) tablet 80 mg (has no administration in time range)  sodium chloride flush (NS) 0.9 % injection 3 mL (has  no administration in time range)  sodium chloride flush (NS) 0.9 % injection 3 mL (has no administration in time range)  0.9 %  sodium chloride infusion (has no administration in time range)  aspirin chewable tablet 81 mg (has no administration in time range)  0.9% sodium chloride infusion (has no administration in time range)    Followed by  0.9% sodium chloride infusion (has no administration in time range)  oxyCODONE-acetaminophen (PERCOCET/ROXICET) 5-325 MG per tablet 1 tablet (has no administration in time range)    And  oxyCODONE (Oxy IR/ROXICODONE) immediate release tablet 5 mg (has no administration in time range)  iopamidol (ISOVUE-370) 76 % injection 100 mL (100 mLs Intravenous Contrast Given 05/11/18 2113)  aspirin EC tablet 325 mg (325 mg Oral Given 05/11/18 2144)  heparin bolus via infusion 4,000 Units (4,000 Units Intravenous Bolus from Bag 05/12/18 0001)     Initial Impression / Assessment and Plan / ED Course  I have reviewed the triage vital signs and the nursing notes.  Pertinent labs & imaging results that were available during my care of the patient were reviewed by me and considered in my medical decision making (see chart for  details).     Patient presenting with chest pain with suspected NSTEMI.  Patient had foot surgery 2 days ago.  Patient had episode of heavy chest pressure with reported "fever and chills."  Troponin is 1.72.  Chest x-ray is negative for acute findings.  CBC, BMP unremarkable.  CT angios negative for PE.  I discussed patient case with cardiologist, Dr. Radford Pax, who will admit to her service for further evaluation.  I discussed patient case with orthopedist, Dr. Percell Miller, who advised heparin benefit outweighs risk.  Heparin initiated in the ED.  Dr. Percell Miller advised to try to contact patient's podiatrist tomorrow to make him aware of her admission, and if unable to consult, Dr. Percell Miller is happy to consult.  I appreciate the above consultants for their assistance with the patient.  Final Clinical Impressions(s) / ED Diagnoses   Final diagnoses:  NSTEMI (non-ST elevated myocardial infarction) [facility])    ED Discharge Orders    None           Frederica Kuster, PA-C 05/12/18 0144    Pattricia Boss, MD 05/12/18 1359

## 2018-05-11 NOTE — ED Notes (Signed)
Patient transported to CT 

## 2018-05-11 NOTE — ED Triage Notes (Addendum)
Pt reports having left foot surgery on Friday morning. Pt took an oxycodone for the pain today and 15 minutes after started having chills, fever, nausea and cp that lasted about 10 minutes. Her MD was concerned she may have a blood clot. Pain currently 3/10, central. Pt is not able to take blood thinners d/t a eye polyp. Pt's left foot currently in a boot.

## 2018-05-11 NOTE — ED Notes (Signed)
I Stat Trop I results of 1.27 reported to Dr. Hart Robinsons

## 2018-05-11 NOTE — H&P (Signed)
Admit date: 05/11/2018 Referring Physician Dr. Jeanell Sparrow Primary Cardiologist none Chief complaint/reason for admission:CP with elevated troponin  HPI: Angie Elliott is a 62 y.o. female who is being seen today for the evaluation of chest pain and elevated troponin  at the request of No ref. provider found.  This is a 62yo female with a history of anxiety who had left foot surgery on the left big toe with a pin done on Friday for a toe fracture by Podiatry without complications.  Since her surgery she has been nauseated. Earlier today she had an episode of SSCP after taking a percocet for her foot pain.  She described the discomfort as a heaviness.  This was associated with nausea and diaphoresis.  There was no radiation of the discomfort and no SOB.  The sx resolved within 10 minutes.  Her nausea persisted and she presented to the ER for further evaluation.  She currently is pain free but started having tingling in her left arm a few minutes ago.  She has had CP in the past but thought it was related to anxiety.  She denies any DOE, orthopnea, PND, palpitations, dizziness or syncope.  She has no hx of CAD, denies tobacco use but her father had CAD and CABG late in life.  She has no HTN, hyperlipidemia with DM.     PMH:    Past Medical History:  Diagnosis Date  . Allergy   . Anxiety   . Cataract     PSH:    Past Surgical History:  Procedure Laterality Date  . BACK SURGERY N/A   . DG GREAT TOE LEFT FOOT  05/08/2018   pin put in   . knee surgery x 2 Bilateral     ALLERGIES:   Patient has no known allergies.  Prior to Admit Meds:   (Not in a hospital admission) Family HX:    Family History  Problem Relation Age of Onset  . Pancreatic cancer Mother   . Heart failure Father        valvular heart disease  . CAD Father        s/p CABG in 34's  . Colon cancer Neg Hx   . Esophageal cancer Neg Hx   . Rectal cancer Neg Hx   . Stomach cancer Neg Hx    Social HX:    Social History    Socioeconomic History  . Marital status: Married    Spouse name: Not on file  . Number of children: Not on file  . Years of education: Not on file  . Highest education level: Not on file  Occupational History  . Not on file  Social Needs  . Financial resource strain: Not on file  . Food insecurity:    Worry: Not on file    Inability: Not on file  . Transportation needs:    Medical: Not on file    Non-medical: Not on file  Tobacco Use  . Smoking status: Never Smoker  . Smokeless tobacco: Never Used  Substance and Sexual Activity  . Alcohol use: Yes    Comment: occasional beer.   . Drug use: No  . Sexual activity: Not on file  Lifestyle  . Physical activity:    Days per week: Not on file    Minutes per session: Not on file  . Stress: Not on file  Relationships  . Social connections:    Talks on phone: Not on file    Gets together: Not on  file    Attends religious service: Not on file    Active member of club or organization: Not on file    Attends meetings of clubs or organizations: Not on file    Relationship status: Not on file  . Intimate partner violence:    Fear of current or ex partner: Not on file    Emotionally abused: Not on file    Physically abused: Not on file    Forced sexual activity: Not on file  Other Topics Concern  . Not on file  Social History Narrative  . Not on file     ROS:  All ROS were addressed and are negative except what is stated in the HPI  PHYSICAL EXAM Vitals:   05/12/18 0000 05/12/18 0030  BP: 129/63 120/65  Pulse: 65 69  Resp: 20 15  Temp:    SpO2: 99% 100%   General: Well developed, well nourished, in no acute distress Head: Eyes PERRLA, No xanthomas.   Normal cephalic and atramatic  Lungs:  Clear bilaterally to auscultation and percussion. Heart:   HRRR S1 S2 Pulses are 2+ & equal.            No carotid bruit. No JVD.  No abdominal bruits. No femoral bruits. Abdomen: Bowel sounds are positive, abdomen soft and  non-tender without masses or                  Hernia's noted. Msk:  Back normal, normal gait. Normal strength and tone for age. Extremities:   No clubbing, cyanosis or edema.  DP +1 Neuro: Alert and oriented X 3. Psych:  Good affect, responds appropriately   Labs:   Lab Results  Component Value Date   WBC 6.3 05/11/2018   HGB 13.1 05/11/2018   HCT 40.4 05/11/2018   MCV 90.8 05/11/2018   PLT 179 05/11/2018    Recent Labs  Lab 05/11/18 2014  NA 136  K 4.7  CL 102  CO2 26  BUN 15  CREATININE 0.90  CALCIUM 9.2  GLUCOSE 134*   No results found for: CKTOTAL, CKMB, CKMBINDEX, TROPONINI No results found for: PTT No results found for: INR, PROTIME  No results found for: CHOL No results found for: HDL No results found for: LDLCALC No results found for: TRIG No results found for: CHOLHDL No results found for: LDLDIRECT    Radiology:  Dg Chest 2 View  Result Date: 05/11/2018 CLINICAL DATA:  Chest pain. EXAM: CHEST - 2 VIEW COMPARISON:  None. FINDINGS: The cardiomediastinal contours are normal. Lingular subsegmental atelectasis or scarring. Biapical pleuroparenchymal scarring. Pulmonary vasculature is normal. No consolidation, pleural effusion, or pneumothorax. No acute osseous abnormalities are seen. IMPRESSION: Lingular subsegmental atelectasis or scarring. Biapical pleuroparenchymal scarring. No acute pulmonary process. Electronically Signed   By: Jeb Levering M.D.   On: 05/11/2018 21:02   Ct Angio Chest Pe W And/or Wo Contrast  Result Date: 05/11/2018 CLINICAL DATA:  PE suspected, high pretest prob. Chest pain, nausea, fever and chills onset today. Left foot surgery 2 days ago. EXAM: CT ANGIOGRAPHY CHEST WITH CONTRAST TECHNIQUE: Multidetector CT imaging of the chest was performed using the standard protocol during bolus administration of intravenous contrast. Multiplanar CT image reconstructions and MIPs were obtained to evaluate the vascular anatomy. CONTRAST:  132mL  ISOVUE-370 IOPAMIDOL (ISOVUE-370) INJECTION 76% COMPARISON:  Radiographs earlier this day. FINDINGS: Cardiovascular: There are no filling defects within the pulmonary arteries to suggest pulmonary embolus. Thoracic aorta is normal in caliber, no periaortic  stranding to suggest dissection. The heart is normal in size. Minimal pericardial fluid may be physiologic or small effusion. Mediastinum/Nodes: No enlarged mediastinal or hilar lymph nodes. The esophagus is decompressed. No visualized thyroid nodule. Lungs/Pleura: Subsegmental atelectasis or scarring in the lingula. Mild biapical pleuroparenchymal scarring. No consolidation or pulmonary edema. No pleural fluid. No pulmonary mass or suspicious nodule. Trachea mainstem bronchi are patent. Upper Abdomen: Hepatic cysts, characterized on 12/14/2010 abdominal MRI. No acute findings. Musculoskeletal: There are no acute or suspicious osseous abnormalities. Review of the MIP images confirms the above findings. IMPRESSION: 1. No pulmonary embolus or acute intrathoracic abnormality. 2. Subsegmental atelectasis or scarring in the lingula, mild biapical pleuroparenchymal scarring. Electronically Signed   By: Jeb Levering M.D.   On: 05/11/2018 21:58     Telemetry    NSR - Personally Reviewed  ECG    NSR with anteroseptal infarct age undetermined - Personally Reviewed   ASSESSMENT/PLAN:    1.  NSTEMI - patient presents with typical unstable anginal sx and ruled in for NSTEMI by troponin of 1.27.  She has not real CRFs except postmenopausal state.  She has never smoked but dose have a family hx of CAD late in life with her Dad.  Chest CT angio negative for PE. -admit to tele bed -cycle troponin until it peaks -IV Heparin gtt per pharmacy -IV NTG gtt -ASA 81mg  daily -start Lipitor 80mg  daily -check FLP and ALT in am -Check HbA1C in am -no BB due to borderline low HR -NPO after MN for cath in am -Cardiac catheterization was discussed with the patient  fully. The patient understands that risks include but are not limited to stroke (1 in 1000), death (1 in 1), kidney failure [usually temporary] (1 in 500), bleeding (1 in 200), allergic reaction [possibly serious] (1 in 200).  The patient understands and is willing to proceed.   -check 2D echo in am to assess LVF -ER MD Dr. Jeanell Sparrow discussed with Ortho who felt it was safe to start anticoagulation  Will notify Podiatry in am of patient's admission -Patient concerned about her hx of polyp in her eye that has bled remotely.  She is followed at [facility] and gets eye injections and has been stable over the past year.  I explained that she needs to be anticoagulated for her heart given evidence of myocardial damage with elevated trop and is still having sx of left arm tingling.  Will follow closely   Fransico Him, MD  05/12/2018  12:56 AM

## 2018-05-11 NOTE — ED Notes (Signed)
Pt ready for transport

## 2018-05-11 NOTE — ED Notes (Signed)
Pt returned from CT °

## 2018-05-11 NOTE — ED Notes (Addendum)
Pt endorses 1 episode of chest pain today. Pt states she had toe surgery on Friday with pin placement and has been taking oxycodone at home. Pt reports she took 1 of those and shortly after experienced the chest pain, nausea, and chills. Pt called the surgeon's office who told her if she had the pain again to come in the ED however she decided to just come in for evaluation.

## 2018-05-11 NOTE — Progress Notes (Signed)
ANTICOAGULATION CONSULT NOTE - Initial Consult  Pharmacy Consult for Heparin Indication: chest pain/ACS  No Known Allergies  Patient Measurements: Height: 5\' 10"  (177.8 cm) Weight: 169 lb (76.7 kg) IBW/kg (Calculated) : 68.5  Vital Signs: Temp: 97.6 F (36.4 C) (06/09 2010) Temp Source: Oral (06/09 2010) BP: 125/65 (06/09 2200) Pulse Rate: 64 (06/09 2200)  Labs: Recent Labs    05/11/18 2014  HGB 13.1  HCT 40.4  PLT 179  CREATININE 0.90    Estimated Creatinine Clearance: 70.1 mL/min (by C-G formula based on SCr of 0.9 mg/dL).   Medical History: Past Medical History:  Diagnosis Date  . Allergy   . Anxiety   . Cataract     Medications:  Current Facility-Administered Medications on File Prior to Encounter  Medication Dose Route Frequency Provider Last Rate Last Dose  . 0.9 %  sodium chloride infusion  500 mL Intravenous Continuous Milus Banister, MD       Current Outpatient Medications on File Prior to Encounter  Medication Sig Dispense Refill  . acetaminophen (TYLENOL) 500 MG tablet Take 500 mg by mouth every 6 (six) hours as needed for mild pain.     . cephALEXin (KEFLEX) 500 MG capsule Take 1 capsule (500 mg total) by mouth 3 (three) times daily. 30 capsule 0  . docusate sodium (COLACE) 100 MG capsule Take 100-300 mg by mouth daily as needed for mild constipation.    Marland Kitchen ibuprofen (ADVIL,MOTRIN) 800 MG tablet Take 800 mg by mouth every 8 (eight) hours as needed for moderate pain.    Marland Kitchen ondansetron (ZOFRAN) 4 MG tablet Take 1 tablet (4 mg total) by mouth every 8 (eight) hours as needed for nausea or vomiting. 20 tablet 0  . oxyCODONE-acetaminophen (PERCOCET) 10-325 MG tablet Take 1 tablet by mouth every 6 (six) hours as needed for up to 7 days for pain. 28 tablet 0  . [DISCONTINUED] OVER THE COUNTER MEDICATION Fiber Gummies 4 g  Two tablets daily.    . [DISCONTINUED] sulfamethoxazole-trimethoprim (BACTRIM DS,SEPTRA DS) 800-160 MG tablet   0     Assessment: 62  y.o. female with chest pain for heparin   Goal of Therapy:  Heparin level 0.3-0.7 units/ml Monitor platelets by anticoagulation protocol: Yes   Plan:  Heparin 4000 units IV bolus, then start heparin 900 units/hr Check heparin level in 6 hours.   Shala Baumbach, Bronson Curb 05/11/2018,10:52 PM

## 2018-05-12 ENCOUNTER — Inpatient Hospital Stay (HOSPITAL_COMMUNITY)
Admission: EM | Disposition: A | Discharge status: Home / Self Care | Payer: Self-pay | Source: Home / Self Care | Attending: Cardiology

## 2018-05-12 ENCOUNTER — Telehealth: Payer: Self-pay | Admitting: *Deleted

## 2018-05-12 ENCOUNTER — Inpatient Hospital Stay (HOSPITAL_COMMUNITY): Payer: Commercial Managed Care - PPO

## 2018-05-12 DIAGNOSIS — I361 Nonrheumatic tricuspid (valve) insufficiency: Secondary | ICD-10-CM

## 2018-05-12 DIAGNOSIS — Z79899 Other long term (current) drug therapy: Secondary | ICD-10-CM | POA: Diagnosis not present

## 2018-05-12 DIAGNOSIS — N959 Unspecified menopausal and perimenopausal disorder: Secondary | ICD-10-CM | POA: Diagnosis present

## 2018-05-12 DIAGNOSIS — I200 Unstable angina: Secondary | ICD-10-CM | POA: Diagnosis present

## 2018-05-12 DIAGNOSIS — I214 Non-ST elevation (NSTEMI) myocardial infarction: Secondary | ICD-10-CM | POA: Diagnosis present

## 2018-05-12 DIAGNOSIS — R748 Abnormal levels of other serum enzymes: Secondary | ICD-10-CM

## 2018-05-12 DIAGNOSIS — R7989 Other specified abnormal findings of blood chemistry: Secondary | ICD-10-CM

## 2018-05-12 DIAGNOSIS — I351 Nonrheumatic aortic (valve) insufficiency: Secondary | ICD-10-CM | POA: Diagnosis not present

## 2018-05-12 DIAGNOSIS — R778 Other specified abnormalities of plasma proteins: Secondary | ICD-10-CM

## 2018-05-12 DIAGNOSIS — R079 Chest pain, unspecified: Secondary | ICD-10-CM

## 2018-05-12 DIAGNOSIS — S92402D Displaced unspecified fracture of left great toe, subsequent encounter for fracture with routine healing: Secondary | ICD-10-CM | POA: Diagnosis not present

## 2018-05-12 DIAGNOSIS — Z8249 Family history of ischemic heart disease and other diseases of the circulatory system: Secondary | ICD-10-CM | POA: Diagnosis not present

## 2018-05-12 HISTORY — PX: LEFT HEART CATH AND CORONARY ANGIOGRAPHY: CATH118249

## 2018-05-12 LAB — BASIC METABOLIC PANEL
Anion gap: 7 (ref 5–15)
BUN: 9 mg/dL (ref 6–20)
CALCIUM: 9 mg/dL (ref 8.9–10.3)
CO2: 28 mmol/L (ref 22–32)
Chloride: 106 mmol/L (ref 101–111)
Creatinine, Ser: 0.96 mg/dL (ref 0.44–1.00)
GFR calc Af Amer: >60 mL/min (ref >60)
GLUCOSE: 104 mg/dL — AB (ref 65–99)
Potassium: 4.3 mmol/L (ref 3.5–5.1)
Sodium: 141 mmol/L (ref 135–145)

## 2018-05-12 LAB — MAGNESIUM: MAGNESIUM: 2 mg/dL (ref 1.7–2.4)

## 2018-05-12 LAB — CBC WITH DIFFERENTIAL/PLATELET
Abs Immature Granulocytes: 0 10*3/uL (ref 0.0–0.1)
BASOS ABS: 0 10*3/uL (ref 0.0–0.1)
Basophils Relative: 1 %
EOS ABS: 0 10*3/uL (ref 0.0–0.7)
Eosinophils Relative: 1 %
HCT: 39.6 % (ref 36.0–46.0)
Hemoglobin: 13 g/dL (ref 12.0–15.0)
IMMATURE GRANULOCYTES: 0 %
LYMPHS ABS: 1.7 10*3/uL (ref 0.7–4.0)
Lymphocytes Relative: 23 %
MCH: 29.6 pg (ref 26.0–34.0)
MCHC: 32.8 g/dL (ref 30.0–36.0)
MCV: 90.2 fL (ref 78.0–100.0)
Monocytes Absolute: 0.5 10*3/uL (ref 0.1–1.0)
Monocytes Relative: 7 %
NEUTROS PCT: 68 %
Neutro Abs: 5.1 10*3/uL (ref 1.7–7.7)
PLATELETS: 178 10*3/uL (ref 150–400)
RBC: 4.39 MIL/uL (ref 3.87–5.11)
RDW: 13.3 % (ref 11.5–15.5)
WBC: 7.3 10*3/uL (ref 4.0–10.5)

## 2018-05-12 LAB — HIV ANTIBODY (ROUTINE TESTING W REFLEX): HIV SCREEN 4TH GENERATION: NONREACTIVE

## 2018-05-12 LAB — CBC
HEMATOCRIT: 39.2 % (ref 36.0–46.0)
Hemoglobin: 12.6 g/dL (ref 12.0–15.0)
MCH: 29.3 pg (ref 26.0–34.0)
MCHC: 32.1 g/dL (ref 30.0–36.0)
MCV: 91.2 fL (ref 78.0–100.0)
PLATELETS: 179 10*3/uL (ref 150–400)
RBC: 4.3 MIL/uL (ref 3.87–5.11)
RDW: 13.5 % (ref 11.5–15.5)
WBC: 6.5 10*3/uL (ref 4.0–10.5)

## 2018-05-12 LAB — TROPONIN I
Troponin I: <0.03 ng/mL (ref <0.03)
Troponin I: <0.03 ng/mL (ref <0.03)

## 2018-05-12 LAB — COMPREHENSIVE METABOLIC PANEL
ALT: 11 U/L — AB (ref 14–54)
AST: 24 U/L (ref 15–41)
Albumin: 3.8 g/dL (ref 3.5–5.0)
Alkaline Phosphatase: 65 U/L (ref 38–126)
Anion gap: 11 (ref 5–15)
BUN: 12 mg/dL (ref 6–20)
CHLORIDE: 103 mmol/L (ref 101–111)
CO2: 24 mmol/L (ref 22–32)
CREATININE: 0.93 mg/dL (ref 0.44–1.00)
Calcium: 9.1 mg/dL (ref 8.9–10.3)
GFR calc Af Amer: >60 mL/min (ref >60)
Glucose, Bld: 114 mg/dL — ABNORMAL HIGH (ref 65–99)
Potassium: 4 mmol/L (ref 3.5–5.1)
Sodium: 138 mmol/L (ref 135–145)
Total Bilirubin: 0.6 mg/dL (ref 0.3–1.2)
Total Protein: 6.7 g/dL (ref 6.5–8.1)

## 2018-05-12 LAB — LIPID PANEL
Cholesterol: 205 mg/dL — ABNORMAL HIGH (ref 0–200)
HDL: 97 mg/dL (ref >40)
LDL Cholesterol: 100 mg/dL — ABNORMAL HIGH (ref 0–99)
Total CHOL/HDL Ratio: 2.1 RATIO
Triglycerides: 39 mg/dL (ref <150)
VLDL: 8 mg/dL (ref 0–40)

## 2018-05-12 LAB — HEMOGLOBIN A1C
HEMOGLOBIN A1C: 5.7 % — AB (ref 4.8–5.6)
MEAN PLASMA GLUCOSE: 116.89 mg/dL

## 2018-05-12 LAB — HEPARIN LEVEL (UNFRACTIONATED)
HEPARIN UNFRACTIONATED: 0.49 [IU]/mL (ref 0.30–0.70)
HEPARIN UNFRACTIONATED: 0.55 [IU]/mL (ref 0.30–0.70)

## 2018-05-12 LAB — ECHOCARDIOGRAM COMPLETE
Height: 70 in
Weight: 2704 oz

## 2018-05-12 LAB — APTT: APTT: 99 s — AB (ref 24–36)

## 2018-05-12 LAB — PROTIME-INR
INR: 1
PROTHROMBIN TIME: 13.1 s (ref 11.4–15.2)

## 2018-05-12 LAB — TSH: TSH: 2.323 u[IU]/mL (ref 0.350–4.500)

## 2018-05-12 SURGERY — LEFT HEART CATH AND CORONARY ANGIOGRAPHY
Anesthesia: LOCAL

## 2018-05-12 MED ORDER — LIDOCAINE HCL (PF) 1 % IJ SOLN
INTRAMUSCULAR | Status: AC
  Filled 2018-05-12: qty 30

## 2018-05-12 MED ORDER — OXYCODONE-ACETAMINOPHEN 5-325 MG PO TABS
1.0000 | ORAL_TABLET | Freq: Four times a day (QID) | ORAL | Status: DC | PRN
  Administered 2018-05-12: 1 via ORAL
  Filled 2018-05-12: qty 1

## 2018-05-12 MED ORDER — NITROGLYCERIN IN D5W 200-5 MCG/ML-% IV SOLN
0.0000 ug/min | INTRAVENOUS | Status: DC
  Administered 2018-05-12: 5 ug/min via INTRAVENOUS
  Filled 2018-05-12: qty 250

## 2018-05-12 MED ORDER — DOCUSATE SODIUM 100 MG PO CAPS: 100.0000 mg | ORAL_CAPSULE | Freq: Every day | ORAL | Status: DC | PRN

## 2018-05-12 MED ORDER — SODIUM CHLORIDE 0.9% FLUSH
3.0000 mL | Freq: Two times a day (BID) | INTRAVENOUS | Status: DC
  Administered 2018-05-12: 3 mL via INTRAVENOUS

## 2018-05-12 MED ORDER — ACETAMINOPHEN 325 MG PO TABS: 650.0000 mg | ORAL_TABLET | ORAL | Status: DC | PRN

## 2018-05-12 MED ORDER — NITROGLYCERIN 0.4 MG SL SUBL: 0.4000 mg | SUBLINGUAL_TABLET | SUBLINGUAL | Status: DC | PRN

## 2018-05-12 MED ORDER — ACETAMINOPHEN 500 MG PO TABS: 500.0000 mg | ORAL_TABLET | Freq: Four times a day (QID) | ORAL | Status: DC | PRN

## 2018-05-12 MED ORDER — ATORVASTATIN CALCIUM 20 MG PO TABS: 80.0000 mg | ORAL_TABLET | Freq: Every day | ORAL | Status: DC

## 2018-05-12 MED ORDER — FENTANYL CITRATE (PF) 100 MCG/2ML IJ SOLN
INTRAMUSCULAR | Status: AC
  Filled 2018-05-12: qty 2

## 2018-05-12 MED ORDER — VERAPAMIL HCL 2.5 MG/ML IV SOLN
INTRAVENOUS | Status: AC
  Filled 2018-05-12: qty 2

## 2018-05-12 MED ORDER — ONDANSETRON HCL 4 MG/2ML IJ SOLN
4.0000 mg | Freq: Four times a day (QID) | INTRAMUSCULAR | Status: DC | PRN
  Administered 2018-05-12: 4 mg via INTRAVENOUS
  Filled 2018-05-12: qty 2

## 2018-05-12 MED ORDER — FENTANYL CITRATE (PF) 100 MCG/2ML IJ SOLN
INTRAMUSCULAR | Status: DC | PRN
  Administered 2018-05-12: 25 ug via INTRAVENOUS

## 2018-05-12 MED ORDER — OXYCODONE HCL 5 MG PO TABS
5.0000 mg | ORAL_TABLET | Freq: Four times a day (QID) | ORAL | Status: DC | PRN
  Administered 2018-05-12: 5 mg via ORAL
  Filled 2018-05-12: qty 1

## 2018-05-12 MED ORDER — HEPARIN (PORCINE) IN NACL 1000-0.9 UT/500ML-% IV SOLN
INTRAVENOUS | Status: AC
  Filled 2018-05-12: qty 1000

## 2018-05-12 MED ORDER — SODIUM CHLORIDE 0.9 % WEIGHT BASED INFUSION: 1.0000 mL/kg/h | INTRAVENOUS | Status: DC

## 2018-05-12 MED ORDER — CEPHALEXIN 250 MG PO CAPS
500.0000 mg | ORAL_CAPSULE | Freq: Three times a day (TID) | ORAL | Status: DC
  Filled 2018-05-12: qty 2

## 2018-05-12 MED ORDER — HEPARIN SODIUM (PORCINE) 1000 UNIT/ML IJ SOLN
INTRAMUSCULAR | Status: AC
  Filled 2018-05-12: qty 1

## 2018-05-12 MED ORDER — ONDANSETRON HCL 4 MG/2ML IJ SOLN: 4.0000 mg | Freq: Four times a day (QID) | INTRAMUSCULAR | Status: DC | PRN

## 2018-05-12 MED ORDER — LIDOCAINE HCL (PF) 1 % IJ SOLN
INTRAMUSCULAR | Status: DC | PRN
  Administered 2018-05-12: 2 mL

## 2018-05-12 MED ORDER — SODIUM CHLORIDE 0.9% FLUSH: 3.0000 mL | INTRAVENOUS | Status: DC | PRN

## 2018-05-12 MED ORDER — IOPAMIDOL (ISOVUE-370) INJECTION 76%
INTRAVENOUS | Status: AC
  Filled 2018-05-12: qty 100

## 2018-05-12 MED ORDER — SODIUM CHLORIDE 0.9% FLUSH: 3.0000 mL | Freq: Two times a day (BID) | INTRAVENOUS | Status: DC

## 2018-05-12 MED ORDER — IOPAMIDOL (ISOVUE-370) INJECTION 76%
INTRAVENOUS | Status: DC | PRN
  Administered 2018-05-12: 45 mL via INTRA_ARTERIAL

## 2018-05-12 MED ORDER — HEPARIN SODIUM (PORCINE) 1000 UNIT/ML IJ SOLN
INTRAMUSCULAR | Status: DC | PRN
  Administered 2018-05-12: 4000 [IU] via INTRAVENOUS

## 2018-05-12 MED ORDER — ONDANSETRON HCL 4 MG PO TABS: 4.0000 mg | ORAL_TABLET | Freq: Three times a day (TID) | ORAL | Status: DC | PRN

## 2018-05-12 MED ORDER — ASPIRIN 81 MG PO CHEW: 324.0000 mg | CHEWABLE_TABLET | ORAL | Status: DC

## 2018-05-12 MED ORDER — MIDAZOLAM HCL 2 MG/2ML IJ SOLN
INTRAMUSCULAR | Status: DC | PRN
  Administered 2018-05-12: 1 mg via INTRAVENOUS

## 2018-05-12 MED ORDER — SODIUM CHLORIDE 0.9 % IV SOLN: 250.0000 mL | INTRAVENOUS | Status: DC | PRN

## 2018-05-12 MED ORDER — VERAPAMIL HCL 2.5 MG/ML IV SOLN
INTRAVENOUS | Status: DC | PRN
  Administered 2018-05-12: 15:00:00 via INTRA_ARTERIAL

## 2018-05-12 MED ORDER — HEPARIN (PORCINE) IN NACL 2-0.9 UNITS/ML
INTRAMUSCULAR | Status: AC | PRN
  Administered 2018-05-12 (×2): 500 mL via INTRA_ARTERIAL

## 2018-05-12 MED ORDER — SODIUM CHLORIDE 0.9 % WEIGHT BASED INFUSION
3.0000 mL/kg/h | INTRAVENOUS | Status: DC
  Administered 2018-05-12: 3 mL/kg/h via INTRAVENOUS

## 2018-05-12 MED ORDER — MORPHINE SULFATE (PF) 10 MG/ML IV SOLN: 2.0000 mg | INTRAVENOUS | Status: DC | PRN

## 2018-05-12 MED ORDER — SODIUM CHLORIDE 0.9 % IV SOLN: INTRAVENOUS | Status: AC

## 2018-05-12 MED ORDER — ASPIRIN EC 81 MG PO TBEC: 81.0000 mg | DELAYED_RELEASE_TABLET | Freq: Every day | ORAL | Status: DC

## 2018-05-12 MED ORDER — MIDAZOLAM HCL 2 MG/2ML IJ SOLN
INTRAMUSCULAR | Status: AC
  Filled 2018-05-12: qty 2

## 2018-05-12 MED ORDER — ASPIRIN 300 MG RE SUPP: 300.0000 mg | RECTAL | Status: DC

## 2018-05-12 MED ORDER — ASPIRIN 81 MG PO CHEW: 81.0000 mg | CHEWABLE_TABLET | Freq: Every day | ORAL | Status: DC

## 2018-05-12 MED ORDER — ASPIRIN 81 MG PO CHEW
81.0000 mg | CHEWABLE_TABLET | ORAL | Status: AC
  Administered 2018-05-12: 81 mg via ORAL
  Filled 2018-05-12: qty 1

## 2018-05-12 MED ORDER — OXYCODONE-ACETAMINOPHEN 10-325 MG PO TABS: 1.0000 | ORAL_TABLET | Freq: Four times a day (QID) | ORAL | Status: DC | PRN

## 2018-05-12 MED ORDER — ATORVASTATIN CALCIUM 20 MG PO TABS: 20.0000 mg | ORAL_TABLET | Freq: Every day | ORAL | 3 refills | Status: DC

## 2018-05-12 SURGICAL SUPPLY — 12 items
CATH INFINITI 5FR ANG PIGTAIL (CATHETERS) ×1 IMPLANT
CATH OPTITORQUE TIG 4.0 5F (CATHETERS) ×1 IMPLANT
DEVICE RAD COMP TR BAND LRG (VASCULAR PRODUCTS) ×1 IMPLANT
GLIDESHEATH SLEND A-KIT 6F 22G (SHEATH) ×1 IMPLANT
GUIDEWIRE INQWIRE 1.5J.035X260 (WIRE) IMPLANT
INQWIRE 1.5J .035X260CM (WIRE) ×2
KIT HEART LEFT (KITS) ×2 IMPLANT
PACK CARDIAC CATHETERIZATION (CUSTOM PROCEDURE TRAY) ×2 IMPLANT
SYR MEDRAD MARK V 150ML (SYRINGE) ×2 IMPLANT
TRANSDUCER W/STOPCOCK (MISCELLANEOUS) ×2 IMPLANT
TUBING CIL FLEX 10 FLL-RA (TUBING) ×2 IMPLANT
WIRE HI TORQ VERSACORE-J 145CM (WIRE) ×1 IMPLANT

## 2018-05-12 NOTE — ED Notes (Signed)
Husband Mikki Santee)- [phone] like a call with update, and information when the procedure is going to be.

## 2018-05-12 NOTE — ED Notes (Signed)
Husband Mikki Santee)- [phone] like a call with update, and information when the procedure is going to be.

## 2018-05-12 NOTE — ED Notes (Signed)
Pt placed on purewick 

## 2018-05-12 NOTE — H&P (View-Only) (Signed)
Progress Note  Patient Name: Angie Elliott Date of Encounter: 05/12/2018  Primary Cardiologist: No primary care provider on file.   Subjective   Denies any further CP but still has left arm tingling.  Initial POC trop elevated at 1.27 but serum trop normal x 2 at < 0.03. Still has some mild nausea.   Inpatient Medications    Scheduled Meds: . aspirin  324 mg Oral NOW   Or  . aspirin  300 mg Rectal NOW  . [START ON 05/13/2018] aspirin EC  81 mg Oral Daily  . atorvastatin  80 mg Oral q1800  . cephALEXin  500 mg Oral TID  . sodium chloride flush  3 mL Intravenous Q12H   Continuous Infusions: . sodium chloride    . [START ON 05/13/2018] sodium chloride 3 mL/kg/hr (05/12/18 0622)   Followed by  . [START ON 05/13/2018] sodium chloride    . heparin 900 Units/hr (05/12/18 0277)  . nitroGLYCERIN 30 mcg/min (05/12/18 0945)   PRN Meds: sodium chloride, acetaminophen, docusate sodium, nitroGLYCERIN, ondansetron (ZOFRAN) IV, ondansetron, oxyCODONE-acetaminophen **AND** oxyCODONE, sodium chloride flush   Vital Signs    Vitals:   05/12/18 0900 05/12/18 1000 05/12/18 1100 05/12/18 1130  BP: 125/68 113/69 109/61 110/64  Pulse: 68 65 65 70  Resp: 17 14 14 18   Temp:      TempSrc:      SpO2: 95% 94% 94% 95%  Weight:      Height:        Intake/Output Summary (Last 24 hours) at 05/12/2018 1229 Last data filed at 05/12/2018 4128 Gross per 24 hour  Intake -  Output 750 ml  Net -750 ml   Filed Weights   05/11/18 2011  Weight: 169 lb (76.7 kg)    Telemetry    NSR - Personally Reviewed  ECG    No new EKG to review - Personally Reviewed  Physical Exam   GEN: No acute distress.   Neck: No JVD Cardiac: RRR, no murmurs, rubs, or gallops.  Respiratory: Clear to auscultation bilaterally. GI: Soft, nontender, non-distended  MS: No edema; No deformity. Neuro:  Nonfocal  Psych: Normal affect   Labs    Chemistry Recent Labs  Lab 05/11/18 2014 05/12/18 0129  05/12/18 0728  NA 136 138 141  K 4.7 4.0 4.3  CL 102 103 106  CO2 26 24 28   GLUCOSE 134* 114* 104*  BUN 15 12 9   CREATININE 0.90 0.93 0.96  CALCIUM 9.2 9.1 9.0  PROT  --  6.7  --   ALBUMIN  --  3.8  --   AST  --  24  --   ALT  --  11*  --   ALKPHOS  --  65  --   BILITOT  --  0.6  --   GFRNONAA >60 >60 >60  GFRAA >60 >60 >60  ANIONGAP 8 11 7      Hematology Recent Labs  Lab 05/11/18 2014 05/12/18 0129 05/12/18 0728  WBC 6.3 7.3 6.5  RBC 4.45 4.39 4.30  HGB 13.1 13.0 12.6  HCT 40.4 39.6 39.2  MCV 90.8 90.2 91.2  MCH 29.4 29.6 29.3  MCHC 32.4 32.8 32.1  RDW 13.4 13.3 13.5  PLT 179 178 179    Cardiac Enzymes Recent Labs  Lab 05/12/18 0129 05/12/18 0728  TROPONINI <0.03 <0.03    Recent Labs  Lab 05/11/18 2018  TROPIPOC 1.27*     BNPNo results for input(s): BNP, PROBNP in the last 168  hours.   DDimer No results for input(s): DDIMER in the last 168 hours.   Radiology    Dg Chest 2 View  Result Date: 05/11/2018 CLINICAL DATA:  Chest pain. EXAM: CHEST - 2 VIEW COMPARISON:  None. FINDINGS: The cardiomediastinal contours are normal. Lingular subsegmental atelectasis or scarring. Biapical pleuroparenchymal scarring. Pulmonary vasculature is normal. No consolidation, pleural effusion, or pneumothorax. No acute osseous abnormalities are seen. IMPRESSION: Lingular subsegmental atelectasis or scarring. Biapical pleuroparenchymal scarring. No acute pulmonary process. Electronically Signed   By: Jeb Levering M.D.   On: 05/11/2018 21:02   Ct Angio Chest Pe W And/or Wo Contrast  Result Date: 05/11/2018 CLINICAL DATA:  PE suspected, high pretest prob. Chest pain, nausea, fever and chills onset today. Left foot surgery 2 days ago. EXAM: CT ANGIOGRAPHY CHEST WITH CONTRAST TECHNIQUE: Multidetector CT imaging of the chest was performed using the standard protocol during bolus administration of intravenous contrast. Multiplanar CT image reconstructions and MIPs were obtained to  evaluate the vascular anatomy. CONTRAST:  168mL ISOVUE-370 IOPAMIDOL (ISOVUE-370) INJECTION 76% COMPARISON:  Radiographs earlier this day. FINDINGS: Cardiovascular: There are no filling defects within the pulmonary arteries to suggest pulmonary embolus. Thoracic aorta is normal in caliber, no periaortic stranding to suggest dissection. The heart is normal in size. Minimal pericardial fluid may be physiologic or small effusion. Mediastinum/Nodes: No enlarged mediastinal or hilar lymph nodes. The esophagus is decompressed. No visualized thyroid nodule. Lungs/Pleura: Subsegmental atelectasis or scarring in the lingula. Mild biapical pleuroparenchymal scarring. No consolidation or pulmonary edema. No pleural fluid. No pulmonary mass or suspicious nodule. Trachea mainstem bronchi are patent. Upper Abdomen: Hepatic cysts, characterized on 12/14/2010 abdominal MRI. No acute findings. Musculoskeletal: There are no acute or suspicious osseous abnormalities. Review of the MIP images confirms the above findings. IMPRESSION: 1. No pulmonary embolus or acute intrathoracic abnormality. 2. Subsegmental atelectasis or scarring in the lingula, mild biapical pleuroparenchymal scarring. Electronically Signed   By: Jeb Levering M.D.   On: 05/11/2018 21:58    Cardiac Studies   2D echo pending  Patient Profile     62 y.o. female with no prior cardiac hx presents with CP, left arm tingling and nausea over the past few days.  Initial POC trop was elevated but subsequent trop neg.    Assessment & Plan    1.  Chest pain- patient presents with typical unstable anginal sx and ruled in for NSTEMI by POC  troponin of 1.27 but subsequent Trops were normal.  She has not real CRFs except postmenopausal state.  She has never smoked but dose have a family hx of CAD late in life with her Dad.  Chest CT angio negative for PE. She continues to have left arm tingling so will continue plan for left heart cath later today to define  coronary anatomy.   -continue IV Heparin gtt per pharmacy and IV NTG gtt -continue ASA 81mg  daily, Lipitor 80mg  daily -LDL 100 and HDL 97 -HbA1C normal at 5.7. -BB was not started due to bradycardia on admission -Cardiac catheterization later today.   For questions or updates, please contact [place] Please consult [email or web address] for contact info under Cardiology/STEMI.      Signed, Fransico Him, MD  05/12/2018, 12:29 PM

## 2018-05-12 NOTE — Telephone Encounter (Signed)
POST OP CALL-    1) General condition stated by the patient: Angie Elliott LAST NIGHT AND WENT TO ER, SHE HAS BEEN ADMITTED THINKING SHE MAY HAVE HAD A HEART ATTACK, SET UP FOR PROCEDURES, WITH NO OTHER NEWS AT THIS TIME  2) Is the pt having pain? NO  3) Pain score: 0  4) Has the pt taken Rx'd pain medication, regularly or PRN? N/A  5) Is the pain medication giving relief? N/A  6) Any fever, chills, nausea, or vomiting, shortness of breath or tightness in calf? NO  7) Is the bandage clean, dry and intact? YES  8) Is there excessive tightness, bleeding or drainage coming through the bandage? NO  9) Did you understand all of the post op instruction sheet given? YES  10) Any questions or concerns regarding post op care/recovery? NO    Confirmed POV appointment with patient

## 2018-05-12 NOTE — Discharge Summary (Signed)
Discharge Summary    Patient ID: Angie Elliott,  MRN: [id number], DOB/AGE: [birth year] 62 y.o.  Admit date: 05/11/2018 Discharge date: 05/12/2018  Primary Care Provider: Alroy Dust, L.[place] Primary Cardiologist: No primary care provider on file.  Discharge Diagnoses    Active Problems:   NSTEMI (non-ST elevated myocardial infarction) ([place])   Allergies No Known Allergies  Diagnostic Studies/Procedures    Left heart catheterization 05/12/18:  IMPRESSION: Ms. Angie Elliott has normal coronary arteries and normal LV function.  I believe her first point-of-care troponin was erroneous and her chest pain noncardiac.  She has atypical noncardiac chest/arm pain.   Conclusion     The left ventricular ejection fraction is greater than 65% by visual estimate.  The left ventricular systolic function is normal.  LV end diastolic pressure is normal.   Echocardiogram 05/12/18: Study Conclusions  - Left ventricle: The cavity size was normal. Systolic function was   vigorous. The estimated ejection fraction was in the range of 65%   to 70%. Wall motion was normal; there were no regional wall   motion abnormalities. Doppler parameters are consistent with   abnormal left ventricular relaxation (grade 1 diastolic   dysfunction). There was no evidence of elevated ventricular   filling pressure by Doppler parameters. - Aortic valve: There was mild stenosis. There was no   regurgitation. Peak gradient (S): 13 mm Hg. Valve area (Vmax):   2.51 cm^2. - Mitral valve: There was no regurgitation. - Right ventricle: The cavity size was normal. Wall thickness was   normal. Systolic function was normal. - Right atrium: The atrium was normal in size. - Tricuspid valve: There was mild regurgitation. - Pulmonary arteries: Systolic pressure was at the upper limits of   normal. - Inferior vena cava: The vessel was normal in size. - Pericardium, extracardiac: There was no pericardial  effusion. _____________   History of Present Illness     62yo female with a history of anxiety who had left foot surgery on the left big toe with a pin done on Friday for a toe fracture by Podiatry without complications.  Since her surgery she has been nauseated. Earlier in the day on 05/11/18 she had an episode of SSCP after taking a percocet for her foot pain.  She described the discomfort as a heaviness.  This was associated with nausea and diaphoresis.  There was no radiation of the discomfort and no SOB.  The sx resolved within 10 minutes.  Her nausea persisted and she presented to the ER for further evaluation.  She currently is pain free but started having tingling in her left arm a few minutes ago.  She has had CP in the past but thought it was related to anxiety.  She denies any DOE, orthopnea, PND, palpitations, dizziness or syncope.  She has no hx of CAD, denies tobacco use but her father had CAD and CABG late in life.  She has no HTN, hyperlipidemia, or DM.      Hospital Course     Consultants: None   1. Chest pain: patient presented with SSCP with associated nausea and diaphoresis. iPOC trop elevated to 1.27, however subsequent serum troponin negative x2. EKG with NSR with isolated TWI V2, no STE/D. CTA chest negative for PE. Given concern for NSTEMI she was started on IV heparin and nitroglycerine. Echo with EF 65-70%, G1DD, mild AS, and no wall motion abnormalities. She was taken for [facility] 05/12/18 which revealed normal coronary anatomy. Likely erroneous troponin level and  non-cardiac chest pain. Risk stratification revealed TSH wnl, LDL 100, and HgbA1C 5.7.  - No indication for continued ASA - Will start statin therapy at discharge for elevated LDL for risk management.   She can follow-up with her PCP after discharge.  _____________  Discharge Vitals Blood pressure (!) 106/58, pulse 67, temperature 97.6 F (36.4 C), temperature source Oral, resp. rate 16, height 5\' 10"  (1.778 m),  weight 169 lb (76.7 kg), SpO2 99 %.  Filed Weights   05/11/18 2011  Weight: 169 lb (76.7 kg)    Labs & Radiologic Studies    CBC Recent Labs    05/12/18 0129 05/12/18 0728  WBC 7.3 6.5  NEUTROABS 5.1  --   HGB 13.0 12.6  HCT 39.6 39.2  MCV 90.2 91.2  PLT 178 557   Basic Metabolic Panel Recent Labs    05/12/18 0129 05/12/18 0728  NA 138 141  K 4.0 4.3  CL 103 106  CO2 24 28  GLUCOSE 114* 104*  BUN 12 9  CREATININE 0.93 0.96  CALCIUM 9.1 9.0  MG 2.0  --    Liver Function Tests Recent Labs    05/12/18 0129  AST 24  ALT 11*  ALKPHOS 65  BILITOT 0.6  PROT 6.7  ALBUMIN 3.8   No results for input(s): LIPASE, AMYLASE in the last 72 hours. Cardiac Enzymes Recent Labs    05/12/18 0129 05/12/18 0728 05/12/18 1347  TROPONINI <0.03 <0.03 <0.03   BNP Invalid input(s): POCBNP D-Dimer No results for input(s): DDIMER in the last 72 hours. Hemoglobin A1C Recent Labs    05/12/18 0129  HGBA1C 5.7*   Fasting Lipid Panel Recent Labs    05/12/18 0430  CHOL 205*  HDL 97  LDLCALC 100*  TRIG 39  CHOLHDL 2.1   Thyroid Function Tests Recent Labs    05/12/18 0129  TSH 2.323   _____________  Dg Chest 2 View  Result Date: 05/11/2018 CLINICAL DATA:  Chest pain. EXAM: CHEST - 2 VIEW COMPARISON:  None. FINDINGS: The cardiomediastinal contours are normal. Lingular subsegmental atelectasis or scarring. Biapical pleuroparenchymal scarring. Pulmonary vasculature is normal. No consolidation, pleural effusion, or pneumothorax. No acute osseous abnormalities are seen. IMPRESSION: Lingular subsegmental atelectasis or scarring. Biapical pleuroparenchymal scarring. No acute pulmonary process. Electronically Signed   By: Jeb Levering M.D.   On: 05/11/2018 21:02   Ct Angio Chest Pe W And/or Wo Contrast  Result Date: 05/11/2018 CLINICAL DATA:  PE suspected, high pretest prob. Chest pain, nausea, fever and chills onset today. Left foot surgery 2 days ago. EXAM: CT  ANGIOGRAPHY CHEST WITH CONTRAST TECHNIQUE: Multidetector CT imaging of the chest was performed using the standard protocol during bolus administration of intravenous contrast. Multiplanar CT image reconstructions and MIPs were obtained to evaluate the vascular anatomy. CONTRAST:  196mL ISOVUE-370 IOPAMIDOL (ISOVUE-370) INJECTION 76% COMPARISON:  Radiographs earlier this day. FINDINGS: Cardiovascular: There are no filling defects within the pulmonary arteries to suggest pulmonary embolus. Thoracic aorta is normal in caliber, no periaortic stranding to suggest dissection. The heart is normal in size. Minimal pericardial fluid may be physiologic or small effusion. Mediastinum/Nodes: No enlarged mediastinal or hilar lymph nodes. The esophagus is decompressed. No visualized thyroid nodule. Lungs/Pleura: Subsegmental atelectasis or scarring in the lingula. Mild biapical pleuroparenchymal scarring. No consolidation or pulmonary edema. No pleural fluid. No pulmonary mass or suspicious nodule. Trachea mainstem bronchi are patent. Upper Abdomen: Hepatic cysts, characterized on 12/14/2010 abdominal MRI. No acute findings. Musculoskeletal: There are no acute or  suspicious osseous abnormalities. Review of the MIP images confirms the above findings. IMPRESSION: 1. No pulmonary embolus or acute intrathoracic abnormality. 2. Subsegmental atelectasis or scarring in the lingula, mild biapical pleuroparenchymal scarring. Electronically Signed   By: Jeb Levering M.D.   On: 05/11/2018 21:58   Dg Foot Complete Left  Result Date: 04/29/2018 Please see detailed radiograph report in office note.  Disposition   Patient was seen and examined by Dr. Radford Pax who deemed patient as stable for discharge. Follow-up has been arranged. Discharge medications as listed below.   Follow-up Plans & Appointments    Follow-up Information    Alroy Dust, L.Marlou Sa, MD. Schedule an appointment as soon as possible for a visit.   Specialty:  Family  Medicine Why:  Please call to schedule an appointment to be seen within 1-2 weeks of discharge. Contact information: [address] [place] [address] [phone]            Discharge Medications   Allergies as of 05/12/2018   No Known Allergies     Medication List    STOP taking these medications   ibuprofen 800 MG tablet Commonly known as:  ADVIL,MOTRIN     TAKE these medications   acetaminophen 500 MG tablet Commonly known as:  TYLENOL Take 500 mg by mouth every 6 (six) hours as needed for mild pain.   atorvastatin 20 MG tablet Commonly known as:  LIPITOR Take 1 tablet (20 mg total) by mouth daily at 6 PM.   cephALEXin 500 MG capsule Commonly known as:  KEFLEX Take 1 capsule (500 mg total) by mouth 3 (three) times daily.   docusate sodium 100 MG capsule Commonly known as:  COLACE Take 100-300 mg by mouth daily as needed for mild constipation.   ondansetron 4 MG tablet Commonly known as:  ZOFRAN Take 1 tablet (4 mg total) by mouth every 8 (eight) hours as needed for nausea or vomiting.   oxyCODONE-acetaminophen 10-325 MG tablet Commonly known as:  PERCOCET Take 1 tablet by mouth every 6 (six) hours as needed for up to 7 days for pain.       Outstanding Labs/Studies   None  Duration of Discharge Encounter   Greater than 30 minutes including physician time.  Signed, Abigail Butts PA-C 05/12/2018, 5:15 PM

## 2018-05-12 NOTE — Progress Notes (Signed)
Progress Note  Patient Name: Angie Elliott Date of Encounter: 05/12/2018  Primary Cardiologist: No primary care provider on file.   Subjective   Denies any further CP but still has left arm tingling.  Initial POC trop elevated at 1.27 but serum trop normal x 2 at < 0.03. Still has some mild nausea.   Inpatient Medications    Scheduled Meds: . aspirin  324 mg Oral NOW   Or  . aspirin  300 mg Rectal NOW  . [START ON 05/13/2018] aspirin EC  81 mg Oral Daily  . atorvastatin  80 mg Oral q1800  . cephALEXin  500 mg Oral TID  . sodium chloride flush  3 mL Intravenous Q12H   Continuous Infusions: . sodium chloride    . [START ON 05/13/2018] sodium chloride 3 mL/kg/hr (05/12/18 0622)   Followed by  . [START ON 05/13/2018] sodium chloride    . heparin 900 Units/hr (05/12/18 6440)  . nitroGLYCERIN 30 mcg/min (05/12/18 0945)   PRN Meds: sodium chloride, acetaminophen, docusate sodium, nitroGLYCERIN, ondansetron (ZOFRAN) IV, ondansetron, oxyCODONE-acetaminophen **AND** oxyCODONE, sodium chloride flush   Vital Signs    Vitals:   05/12/18 0900 05/12/18 1000 05/12/18 1100 05/12/18 1130  BP: 125/68 113/69 109/61 110/64  Pulse: 68 65 65 70  Resp: 17 14 14 18   Temp:      TempSrc:      SpO2: 95% 94% 94% 95%  Weight:      Height:        Intake/Output Summary (Last 24 hours) at 05/12/2018 1229 Last data filed at 05/12/2018 3474 Gross per 24 hour  Intake -  Output 750 ml  Net -750 ml   Filed Weights   05/11/18 2011  Weight: 169 lb (76.7 kg)    Telemetry    NSR - Personally Reviewed  ECG    No new EKG to review - Personally Reviewed  Physical Exam   GEN: No acute distress.   Neck: No JVD Cardiac: RRR, no murmurs, rubs, or gallops.  Respiratory: Clear to auscultation bilaterally. GI: Soft, nontender, non-distended  MS: No edema; No deformity. Neuro:  Nonfocal  Psych: Normal affect   Labs    Chemistry Recent Labs  Lab 05/11/18 2014 05/12/18 0129  05/12/18 0728  NA 136 138 141  K 4.7 4.0 4.3  CL 102 103 106  CO2 26 24 28   GLUCOSE 134* 114* 104*  BUN 15 12 9   CREATININE 0.90 0.93 0.96  CALCIUM 9.2 9.1 9.0  PROT  --  6.7  --   ALBUMIN  --  3.8  --   AST  --  24  --   ALT  --  11*  --   ALKPHOS  --  65  --   BILITOT  --  0.6  --   GFRNONAA >60 >60 >60  GFRAA >60 >60 >60  ANIONGAP 8 11 7      Hematology Recent Labs  Lab 05/11/18 2014 05/12/18 0129 05/12/18 0728  WBC 6.3 7.3 6.5  RBC 4.45 4.39 4.30  HGB 13.1 13.0 12.6  HCT 40.4 39.6 39.2  MCV 90.8 90.2 91.2  MCH 29.4 29.6 29.3  MCHC 32.4 32.8 32.1  RDW 13.4 13.3 13.5  PLT 179 178 179    Cardiac Enzymes Recent Labs  Lab 05/12/18 0129 05/12/18 0728  TROPONINI <0.03 <0.03    Recent Labs  Lab 05/11/18 2018  TROPIPOC 1.27*     BNPNo results for input(s): BNP, PROBNP in the last 168  hours.   DDimer No results for input(s): DDIMER in the last 168 hours.   Radiology    Dg Chest 2 View  Result Date: 05/11/2018 CLINICAL DATA:  Chest pain. EXAM: CHEST - 2 VIEW COMPARISON:  None. FINDINGS: The cardiomediastinal contours are normal. Lingular subsegmental atelectasis or scarring. Biapical pleuroparenchymal scarring. Pulmonary vasculature is normal. No consolidation, pleural effusion, or pneumothorax. No acute osseous abnormalities are seen. IMPRESSION: Lingular subsegmental atelectasis or scarring. Biapical pleuroparenchymal scarring. No acute pulmonary process. Electronically Signed   By: Jeb Levering M.D.   On: 05/11/2018 21:02   Ct Angio Chest Pe W And/or Wo Contrast  Result Date: 05/11/2018 CLINICAL DATA:  PE suspected, high pretest prob. Chest pain, nausea, fever and chills onset today. Left foot surgery 2 days ago. EXAM: CT ANGIOGRAPHY CHEST WITH CONTRAST TECHNIQUE: Multidetector CT imaging of the chest was performed using the standard protocol during bolus administration of intravenous contrast. Multiplanar CT image reconstructions and MIPs were obtained to  evaluate the vascular anatomy. CONTRAST:  179mL ISOVUE-370 IOPAMIDOL (ISOVUE-370) INJECTION 76% COMPARISON:  Radiographs earlier this day. FINDINGS: Cardiovascular: There are no filling defects within the pulmonary arteries to suggest pulmonary embolus. Thoracic aorta is normal in caliber, no periaortic stranding to suggest dissection. The heart is normal in size. Minimal pericardial fluid may be physiologic or small effusion. Mediastinum/Nodes: No enlarged mediastinal or hilar lymph nodes. The esophagus is decompressed. No visualized thyroid nodule. Lungs/Pleura: Subsegmental atelectasis or scarring in the lingula. Mild biapical pleuroparenchymal scarring. No consolidation or pulmonary edema. No pleural fluid. No pulmonary mass or suspicious nodule. Trachea mainstem bronchi are patent. Upper Abdomen: Hepatic cysts, characterized on 12/14/2010 abdominal MRI. No acute findings. Musculoskeletal: There are no acute or suspicious osseous abnormalities. Review of the MIP images confirms the above findings. IMPRESSION: 1. No pulmonary embolus or acute intrathoracic abnormality. 2. Subsegmental atelectasis or scarring in the lingula, mild biapical pleuroparenchymal scarring. Electronically Signed   By: Jeb Levering M.D.   On: 05/11/2018 21:58    Cardiac Studies   2D echo pending  Patient Profile     62 y.o. female with no prior cardiac hx presents with CP, left arm tingling and nausea over the past few days.  Initial POC trop was elevated but subsequent trop neg.    Assessment & Plan    1.  Chest pain- patient presents with typical unstable anginal sx and ruled in for NSTEMI by POC  troponin of 1.27 but subsequent Trops were normal.  She has not real CRFs except postmenopausal state.  She has never smoked but dose have a family hx of CAD late in life with her Dad.  Chest CT angio negative for PE. She continues to have left arm tingling so will continue plan for left heart cath later today to define  coronary anatomy.   -continue IV Heparin gtt per pharmacy and IV NTG gtt -continue ASA 81mg  daily, Lipitor 80mg  daily -LDL 100 and HDL 97 -HbA1C normal at 5.7. -BB was not started due to bradycardia on admission -Cardiac catheterization later today.   For questions or updates, please contact [place] Please consult [email or web address] for contact info under Cardiology/STEMI.      Signed, Fransico Him, MD  05/12/2018, 12:29 PM

## 2018-05-12 NOTE — Discharge Instructions (Signed)
DRINK PLENTY OF FLUIDS FOR THE NEXT 2-3 DAYS TO KEEP HYDRATED.  Radial Site Care Refer to this sheet in the next few weeks. These instructions provide you with information about caring for yourself after your procedure. Your health care provider may also give you more specific instructions. Your treatment has been planned according to current medical practices, but problems sometimes occur. Call your health care provider if you have any problems or questions after your procedure. What can I expect after the procedure? After your procedure, it is typical to have the following:  Bruising at the radial site that usually fades within 1-2 weeks.  Blood collecting in the tissue (hematoma) that may be painful to the touch. It should usually decrease in size and tenderness within 1-2 weeks.  Follow these instructions at home:  Take medicines only as directed by your health care provider.  You may shower 24-48 hours after the procedure or as directed by your health care provider. Remove the bandage (dressing) and gently wash the site with plain soap and water. Pat the area dry with a clean towel. Do not rub the site, because this may cause bleeding.  Do not take baths, swim, or use a hot tub until your health care provider approves.  Check your insertion site every day for redness, swelling, or drainage.  Do not apply powder or lotion to the site.  Do not flex or bend the affected arm for 24 hours or as directed by your health care provider.  Do not push or pull heavy objects with the affected arm for 24 hours or as directed by your health care provider.  Do not lift over 10 lb (4.5 kg) for 5 days after your procedure or as directed by your health care provider.  Ask your health care provider when it is okay to: ? Return to work or school. ? Resume usual physical activities or sports. ? Resume sexual activity.  Do not drive home if you are discharged the same day as the procedure. Have  someone else drive you.  You may drive 24 hours after the procedure unless otherwise instructed by your health care provider.  Do not operate machinery or power tools for 24 hours after the procedure.  If your procedure was done as an outpatient procedure, which means that you went home the same day as your procedure, a responsible adult should be with you for the first 24 hours after you arrive home.  Keep all follow-up visits as directed by your health care provider. This is important. Contact a health care provider if:  You have a fever.  You have chills.  You have increased bleeding from the radial site. Hold pressure on the site. Get help right away if:  You have unusual pain at the radial site.  You have redness, warmth, or swelling at the radial site.  You have drainage (other than a small amount of blood on the dressing) from the radial site.  The radial site is bleeding, and the bleeding does not stop after 30 minutes of holding steady pressure on the site.  Your arm or hand becomes pale, cool, tingly, or numb. This information is not intended to replace advice given to you by your health care provider. Make sure you discuss any questions you have with your health care provider. Document Released: 12/22/2010 Document Revised: 04/26/2016 Document Reviewed: 06/07/2014 Elsevier Interactive Patient Education  [employer].  For any questions or concerns regarding your cardiac catheterization, you can  call the cardiology office at [phone]

## 2018-05-12 NOTE — Progress Notes (Signed)
ANTICOAGULATION CONSULT NOTE   Pharmacy Consult for Heparin Indication: chest pain/ACS  No Known Allergies  Patient Measurements: Height: 5\' 10"  (177.8 cm) Weight: 169 lb (76.7 kg) IBW/kg (Calculated) : 68.5  Vital Signs: Temp: 97.6 F (36.4 C) (06/09 2010) Temp Source: Oral (06/09 2010) BP: 121/72 (06/10 0400) Pulse Rate: 67 (06/10 0400)  Labs: Recent Labs    05/11/18 2014 05/12/18 0129 05/12/18 0437  HGB 13.1 13.0  --   HCT 40.4 39.6  --   PLT 179 178  --   APTT  --  99*  --   LABPROT  --  13.1  --   INR  --  1.00  --   HEPARINUNFRC  --   --  0.49  CREATININE 0.90 0.93  --   TROPONINI  --  <0.03  --     Estimated Creatinine Clearance: 67.8 mL/min (by C-G formula based on SCr of 0.93 mg/dL).   Medical History: Past Medical History:  Diagnosis Date  . Allergy   . Anxiety   . Cataract     Medications:  Current Facility-Administered Medications on File Prior to Encounter  Medication Dose Route Frequency Provider Last Rate Last Dose  . 0.9 %  sodium chloride infusion  500 mL Intravenous Continuous Milus Banister, MD       Current Outpatient Medications on File Prior to Encounter  Medication Sig Dispense Refill  . acetaminophen (TYLENOL) 500 MG tablet Take 500 mg by mouth every 6 (six) hours as needed for mild pain.     . cephALEXin (KEFLEX) 500 MG capsule Take 1 capsule (500 mg total) by mouth 3 (three) times daily. 30 capsule 0  . docusate sodium (COLACE) 100 MG capsule Take 100-300 mg by mouth daily as needed for mild constipation.    Marland Kitchen ibuprofen (ADVIL,MOTRIN) 800 MG tablet Take 800 mg by mouth every 8 (eight) hours as needed for moderate pain.    Marland Kitchen ondansetron (ZOFRAN) 4 MG tablet Take 1 tablet (4 mg total) by mouth every 8 (eight) hours as needed for nausea or vomiting. 20 tablet 0  . oxyCODONE-acetaminophen (PERCOCET) 10-325 MG tablet Take 1 tablet by mouth every 6 (six) hours as needed for up to 7 days for pain. 28 tablet 0     Assessment: 62 y.o.  female with chest pain for heparin, CBC good, renal function good, PTA meds reviewed, initial heparin level is therapeutic   Goal of Therapy:  Heparin level 0.3-0.7 units/ml Monitor platelets by anticoagulation protocol: Yes   Plan:  Cont heparin at 900 units/hr 1200 HL  Narda Bonds, PharmD, BCPS Clinical Pharmacist Phone: [phone]

## 2018-05-12 NOTE — Progress Notes (Signed)
[facility] BAND REMOVAL  LOCATION:    right radial  DEFLATED PER PROTOCOL:    Yes.    TIME BAND OFF / DRESSING APPLIED:    1645   SITE UPON ARRIVAL:    Level 0  SITE AFTER BAND REMOVAL:    Level 0  CIRCULATION SENSATION AND MOVEMENT:    Within Normal Limits   Yes.    COMMENTS:   Tegaderm dsg applied

## 2018-05-12 NOTE — Progress Notes (Signed)
2D Echocardiogram has been performed.  Angie Elliott 05/12/2018, 9:22 AM

## 2018-05-12 NOTE — Interval H&P Note (Signed)
Cath Lab Visit (complete for each Cath Lab visit)  Clinical Evaluation Leading to the Procedure:   ACS: Yes.    Non-ACS:    Anginal Classification: CCS III  Anti-ischemic medical therapy: No Therapy  Non-Invasive Test Results: No non-invasive testing performed  Prior CABG: No previous CABG      History and Physical Interval Note:  05/12/2018 2:11 PM  Angie Elliott  has presented today for surgery, with the diagnosis of unstable angina  The various methods of treatment have been discussed with the patient and family. After consideration of risks, benefits and other options for treatment, the patient has consented to  Procedure(s): LEFT HEART CATH AND CORONARY ANGIOGRAPHY (N/A) as a surgical intervention .  The patient's history has been reviewed, patient examined, no change in status, stable for surgery.  I have reviewed the patient's chart and labs.  Questions were answered to the patient's satisfaction.     Quay Burow

## 2018-05-12 NOTE — Progress Notes (Signed)
Pt noted bleeding at rt radial site. tegaderm dsg removed/ pressure held for 10 minutes. Small pressure dsg applied. Site level 0.

## 2018-05-12 NOTE — Progress Notes (Signed)
Noted pt normals coronaries and normal LV function. Feel like CP is noncardiac and troponin erroneous. Pt for d/c and does not need to be seen by cardiac rehab.  Rufina Falco, RN BSN 05/12/2018 2:58 PM

## 2018-05-13 ENCOUNTER — Encounter (HOSPITAL_COMMUNITY): Payer: Self-pay | Admitting: Cardiovascular Disease

## 2018-05-15 ENCOUNTER — Ambulatory Visit (INDEPENDENT_AMBULATORY_CARE_PROVIDER_SITE_OTHER): Payer: Self-pay | Admitting: Podiatry

## 2018-05-15 ENCOUNTER — Ambulatory Visit (INDEPENDENT_AMBULATORY_CARE_PROVIDER_SITE_OTHER): Payer: Commercial Managed Care - PPO

## 2018-05-15 VITALS — BP 105/61 | HR 61 | Temp 99.3°F

## 2018-05-15 DIAGNOSIS — M779 Enthesopathy, unspecified: Secondary | ICD-10-CM

## 2018-05-15 DIAGNOSIS — S92415A Nondisplaced fracture of proximal phalanx of left great toe, initial encounter for closed fracture: Secondary | ICD-10-CM | POA: Diagnosis not present

## 2018-05-15 DIAGNOSIS — M778 Other enthesopathies, not elsewhere classified: Secondary | ICD-10-CM

## 2018-05-15 NOTE — Progress Notes (Signed)
She presents today for her first postop visit status post open reduction hallux left.  Date of surgery 05/09/2018.  She denies fever chills nausea vomiting muscle aches and pains.  Have an episode of chest pain went to the emergency room had enzymes drawn and could find no reason for the pain.  Objective: Vital signs are stable alert and oriented x3.  Pulses are palpable.  Dry sterile dressing intact was removed does demonstrate some bleeding at the T intersection of the incision site however there is mild erythema no edema no cellulitis drainage or odor radiographs demonstrate well-placed 0.062 K wire and cerclage wire.  The reduction is perfect and alignment is good.  Assessment: Well-healing surgical toe.  Plan: Redressed today dressed a compressive dressing continue the use of the cam walker keep this foot elevated follow-up with her in 1 week try to remove sutures at that time.

## 2018-05-16 MED FILL — Heparin Sod (Porcine)-NaCl IV Soln 1000 Unit/500ML-0.9%: INTRAVENOUS | Qty: 1000 | Status: AC

## 2018-05-22 ENCOUNTER — Ambulatory Visit (INDEPENDENT_AMBULATORY_CARE_PROVIDER_SITE_OTHER): Payer: Commercial Managed Care - PPO | Admitting: Podiatry

## 2018-05-22 ENCOUNTER — Encounter: Payer: Self-pay | Admitting: Podiatry

## 2018-05-22 DIAGNOSIS — S92415A Nondisplaced fracture of proximal phalanx of left great toe, initial encounter for closed fracture: Secondary | ICD-10-CM

## 2018-05-22 NOTE — Progress Notes (Signed)
She presents today for follow-up of her open reduction internal fixation hallux left states that she is still having some shooting pains in the toes particular if she is been up on the foot for long period time when she goes to bed she is electrical sharp shooting type pains.  Date of surgery is 05/09/2018.  Objective: Vital signs are stable alert and oriented x3.  Pulses are palpable neurologic sensorium is intact degenerative flexors are intact sutures are intact margins are coapting but not well coapted as of yet.  K wires in place to the toe has not been dental removed.  Assessment: Well-healing surgical toe follow-up with her in 1 week.  Plan: Follow-up with her in 1 week.  Redressed today dressed a compressive dressing.  Continue the use of cam walker at all times.

## 2018-05-27 ENCOUNTER — Ambulatory Visit (INDEPENDENT_AMBULATORY_CARE_PROVIDER_SITE_OTHER): Payer: Commercial Managed Care - PPO

## 2018-05-27 ENCOUNTER — Ambulatory Visit (INDEPENDENT_AMBULATORY_CARE_PROVIDER_SITE_OTHER): Payer: Self-pay

## 2018-05-27 DIAGNOSIS — S92415A Nondisplaced fracture of proximal phalanx of left great toe, initial encounter for closed fracture: Secondary | ICD-10-CM

## 2018-05-28 ENCOUNTER — Other Ambulatory Visit: Payer: Self-pay | Admitting: Family Medicine

## 2018-05-28 DIAGNOSIS — R079 Chest pain, unspecified: Secondary | ICD-10-CM | POA: Diagnosis not present

## 2018-05-28 DIAGNOSIS — N951 Menopausal and female climacteric states: Secondary | ICD-10-CM | POA: Diagnosis not present

## 2018-05-28 DIAGNOSIS — L820 Inflamed seborrheic keratosis: Secondary | ICD-10-CM | POA: Diagnosis not present

## 2018-05-29 NOTE — Progress Notes (Signed)
Patient presents s/p open reduction hallux left with Rozanna Boer 05/09/18. She states that she has very minimal pain at this time  Well healing surgical foot, reduction of redness and swelling were noted. Pin in place, no movement noted.    Sutures were removed, wound edges aligned no gapping, no movement of incision lines. Neosporin to tip of K-wire pin and her foot was dressed in dry sterile dressing. She is to remain in her boot and keep her foot dry, and follow up in 2 weeks. Xrays were obtained and reviewed by physician. No movement of K-wire noted, good alignment noted.

## 2018-06-17 ENCOUNTER — Encounter: Payer: Self-pay | Admitting: Podiatry

## 2018-06-17 ENCOUNTER — Ambulatory Visit (INDEPENDENT_AMBULATORY_CARE_PROVIDER_SITE_OTHER): Payer: Commercial Managed Care - PPO | Admitting: Podiatry

## 2018-06-17 ENCOUNTER — Ambulatory Visit (INDEPENDENT_AMBULATORY_CARE_PROVIDER_SITE_OTHER): Payer: Commercial Managed Care - PPO

## 2018-06-17 DIAGNOSIS — Z9889 Other specified postprocedural states: Secondary | ICD-10-CM

## 2018-06-17 DIAGNOSIS — S92415D Nondisplaced fracture of proximal phalanx of left great toe, subsequent encounter for fracture with routine healing: Secondary | ICD-10-CM | POA: Diagnosis not present

## 2018-06-18 NOTE — Progress Notes (Signed)
She presents today for open reduction internal fixation hallux left.  She states that seems to be doing okay date of surgery 05/09/2018.  Currently has been approximately 6 weeks.  Objective: Vital signs are stable she is alert and oriented x3 hallux left retains 062 K wire.  Mild edema no erythema cellulitis drainage or odor margins are well coapted.  Radiographs taken today demonstrate well healing corrective site.  Assessment: Well-healing surgical toe.  Plan: Remove the K wire today without any incident there was some mild bleeding discussed this with patient today placed dressed a compressive dressing recommended that she not get this wet for about 24 to 36 hours she understands that and is amenable to it.  She is very hesitant about continuing to wear the cam walker for another 2 weeks I will follow-up with her at that time to take another set of x-rays to make sure she has not reinjured this.

## 2018-07-01 ENCOUNTER — Ambulatory Visit (INDEPENDENT_AMBULATORY_CARE_PROVIDER_SITE_OTHER): Payer: Commercial Managed Care - PPO

## 2018-07-01 ENCOUNTER — Ambulatory Visit (INDEPENDENT_AMBULATORY_CARE_PROVIDER_SITE_OTHER): Payer: Commercial Managed Care - PPO | Admitting: Podiatry

## 2018-07-01 DIAGNOSIS — S92415D Nondisplaced fracture of proximal phalanx of left great toe, subsequent encounter for fracture with routine healing: Secondary | ICD-10-CM

## 2018-07-02 NOTE — Progress Notes (Signed)
She presents today for follow-up of her open reduction internal fixation distal aspect of the proximal phalanx hallux left.  She states that is doing much better feels a lot better and is starting to be able to move better.  Objective: Vital signs are stable she is alert and oriented x3 presents in her Cam boot today.  At this point there is minimal edema to the hallux she has good range of motion at the level of the metatarsophalangeal joint and some better motion at the hallux interphalangeal joint.  Incision appears to be closed and healed nicely.  Radiographs taken today demonstrate a well-healing open reduction.  Cerclage wires intact  Plan: Place her in a Darco shoe for 2 weeks follow-up with her at that time hopefully will be able to get her back into her tennis shoes in 2 weeks.  Assessment: Well-healing surgical toe.

## 2018-07-03 DIAGNOSIS — Z1231 Encounter for screening mammogram for malignant neoplasm of breast: Secondary | ICD-10-CM | POA: Diagnosis not present

## 2018-07-17 ENCOUNTER — Ambulatory Visit (INDEPENDENT_AMBULATORY_CARE_PROVIDER_SITE_OTHER): Payer: Commercial Managed Care - PPO

## 2018-07-17 ENCOUNTER — Ambulatory Visit (INDEPENDENT_AMBULATORY_CARE_PROVIDER_SITE_OTHER): Payer: Commercial Managed Care - PPO | Admitting: Podiatry

## 2018-07-17 ENCOUNTER — Encounter: Payer: Self-pay | Admitting: Podiatry

## 2018-07-17 DIAGNOSIS — S92415D Nondisplaced fracture of proximal phalanx of left great toe, subsequent encounter for fracture with routine healing: Secondary | ICD-10-CM

## 2018-07-17 DIAGNOSIS — Z9889 Other specified postprocedural states: Secondary | ICD-10-CM

## 2018-07-18 DIAGNOSIS — H3561 Retinal hemorrhage, right eye: Secondary | ICD-10-CM | POA: Diagnosis not present

## 2018-07-18 DIAGNOSIS — H318 Other specified disorders of choroid: Secondary | ICD-10-CM | POA: Diagnosis not present

## 2018-07-19 NOTE — Progress Notes (Signed)
She presents today for her open reduction internal fixation hallux left.  Date of surgery May 09, 2018.  She states that is doing okay.  Objective: Vital signs are stable she is alert and oriented x3.  Pulses are palpable.  Neurologic sensorium is intact still has considerable edema to the hallux left appears to be healing very well though radiographs do demonstrate well healing bone distal portion of the proximal phalanx.  Assessment: Well-healing surgical toe hallux left.  Plan: Follow-up with [place] on an as-needed basis.

## 2018-09-02 DIAGNOSIS — Z23 Encounter for immunization: Secondary | ICD-10-CM | POA: Diagnosis not present

## 2018-09-26 DIAGNOSIS — H3561 Retinal hemorrhage, right eye: Secondary | ICD-10-CM | POA: Diagnosis not present

## 2018-09-26 DIAGNOSIS — H318 Other specified disorders of choroid: Secondary | ICD-10-CM | POA: Diagnosis not present

## 2018-09-26 DIAGNOSIS — H2513 Age-related nuclear cataract, bilateral: Secondary | ICD-10-CM | POA: Diagnosis not present

## 2018-11-12 DIAGNOSIS — N390 Urinary tract infection, site not specified: Secondary | ICD-10-CM | POA: Diagnosis not present

## 2018-11-12 DIAGNOSIS — N3000 Acute cystitis without hematuria: Secondary | ICD-10-CM | POA: Diagnosis not present

## 2018-12-05 DIAGNOSIS — H318 Other specified disorders of choroid: Secondary | ICD-10-CM | POA: Diagnosis not present

## 2018-12-08 ENCOUNTER — Other Ambulatory Visit: Payer: Self-pay | Admitting: Family Medicine

## 2018-12-08 ENCOUNTER — Other Ambulatory Visit (HOSPITAL_COMMUNITY)
Admission: RE | Admit: 2018-12-08 | Discharge: 2018-12-08 | Disposition: A | Discharge status: Home / Self Care | Payer: Commercial Managed Care - PPO | Source: Ambulatory Visit | Attending: Family Medicine | Admitting: Family Medicine

## 2018-12-08 DIAGNOSIS — D489 Neoplasm of uncertain behavior, unspecified: Secondary | ICD-10-CM | POA: Diagnosis not present

## 2018-12-08 DIAGNOSIS — Z124 Encounter for screening for malignant neoplasm of cervix: Secondary | ICD-10-CM | POA: Insufficient documentation

## 2018-12-08 DIAGNOSIS — Z1159 Encounter for screening for other viral diseases: Secondary | ICD-10-CM | POA: Diagnosis not present

## 2018-12-08 DIAGNOSIS — Z0000 Encounter for general adult medical examination without abnormal findings: Secondary | ICD-10-CM | POA: Diagnosis not present

## 2018-12-10 LAB — CYTOLOGY - PAP: Diagnosis: NEGATIVE

## 2019-01-21 DIAGNOSIS — J069 Acute upper respiratory infection, unspecified: Secondary | ICD-10-CM | POA: Diagnosis not present

## 2019-02-11 DIAGNOSIS — R350 Frequency of micturition: Secondary | ICD-10-CM | POA: Diagnosis not present

## 2019-02-11 DIAGNOSIS — J029 Acute pharyngitis, unspecified: Secondary | ICD-10-CM | POA: Diagnosis not present

## 2019-02-27 DIAGNOSIS — H318 Other specified disorders of choroid: Secondary | ICD-10-CM | POA: Diagnosis not present

## 2019-11-20 IMAGING — CT CT ANGIO CHEST
3 of 7 series · 17 of 36 positions shown · IV contrast (Omni 300)
Comparison: Radiographs earlier this day.

CLINICAL DATA: PE suspected, high pretest prob. Chest pain, nausea,
fever and chills onset today. Left foot surgery 2 days ago.

EXAM:
CT ANGIOGRAPHY CHEST WITH CONTRAST
TECHNIQUE: Multidetector CT imaging of the chest was performed using the
standard protocol during bolus administration of intravenous
contrast. Multiplanar CT image reconstructions and MIPs were
obtained to evaluate the vascular anatomy.
CONTRAST:  100mL PDKK7P-OJQ IOPAMIDOL (PDKK7P-OJQ) INJECTION 76%

[Series 7: pe lung · axial · 0.83mm/px · z∈[+1144,+1308]mm · 4 of 138 slices shown]
[im 28/138  mediastinal]
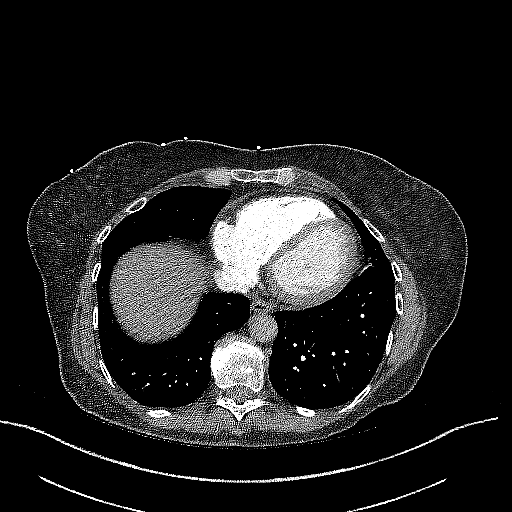
[im 55/138  mediastinal]
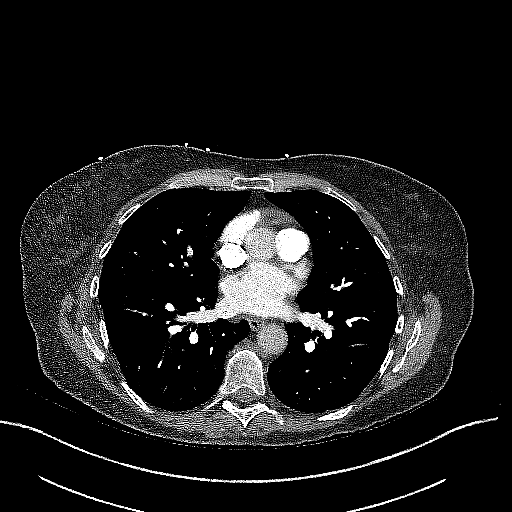
[im 83/138  mediastinal]
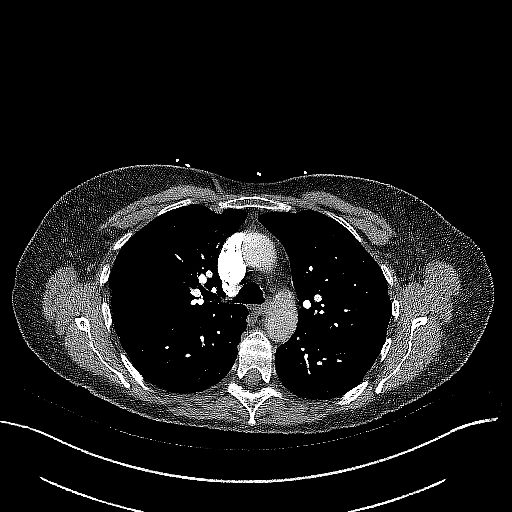
[im 110/138  mediastinal]
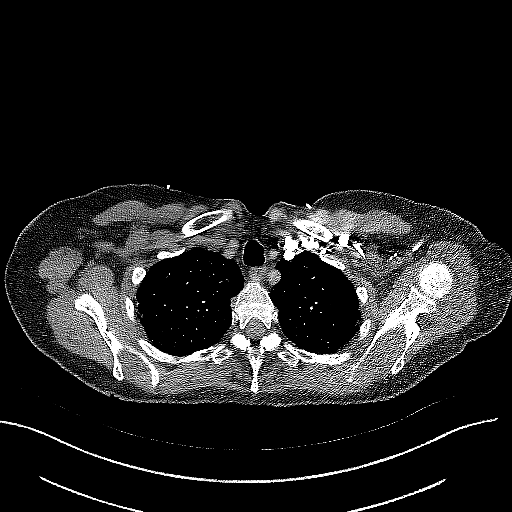

[Series 8: pe thins · axial · 0.83mm/px · z∈[+1072,+1339]mm · 12 of 317 slices shown]
[im 25/317  lung]
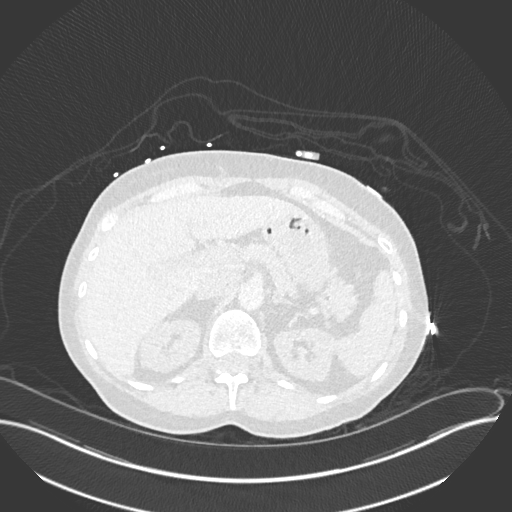
[im 49/317  mediastinal]
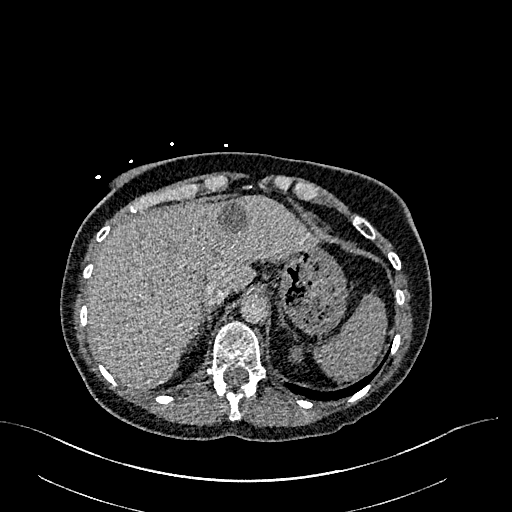
[im 73/317  lung]
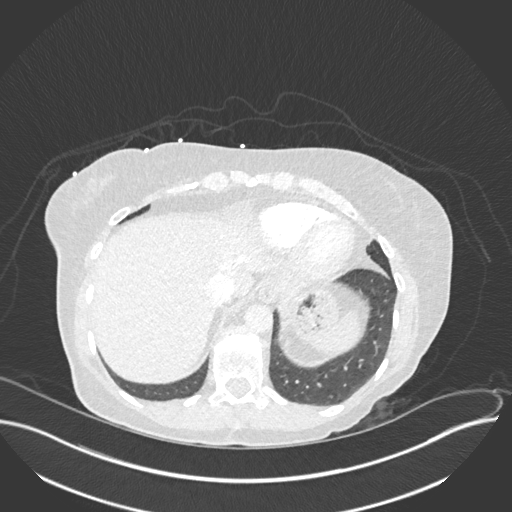
[im 98/317  mediastinal]
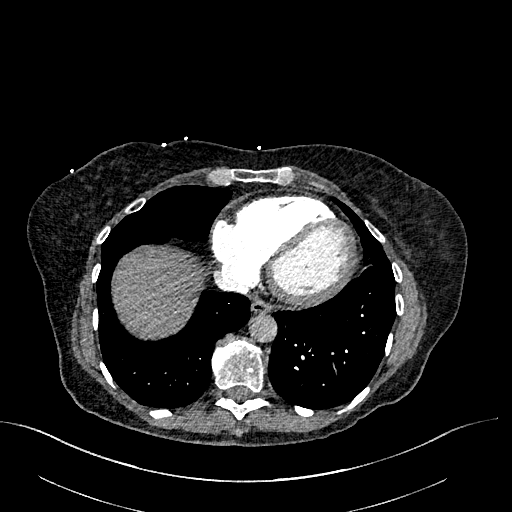
[im 122/317  lung]
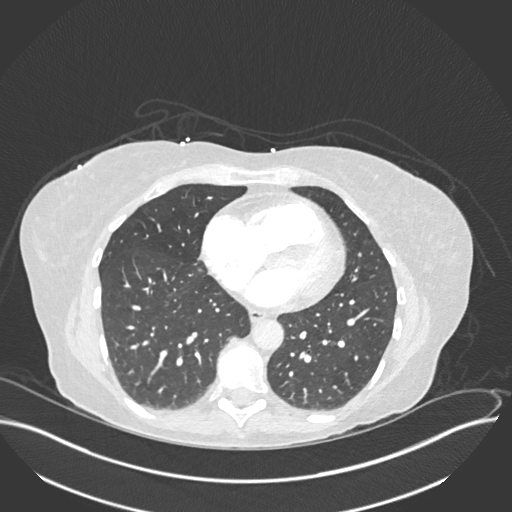
[im 146/317  mediastinal]
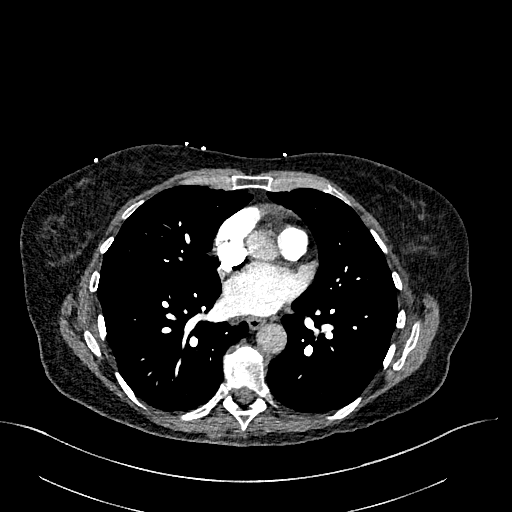
[im 171/317  lung]
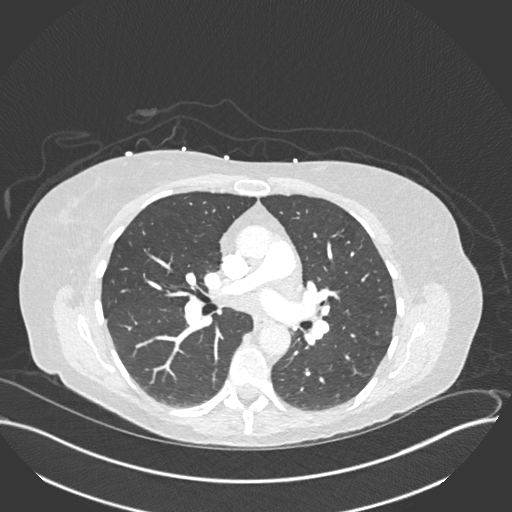
[im 195/317  mediastinal]
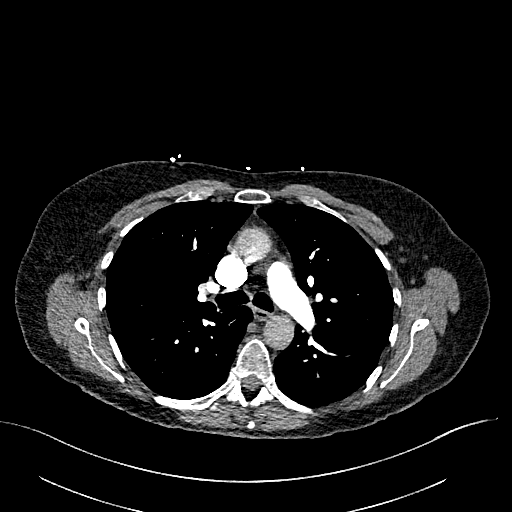
[im 219/317  lung]
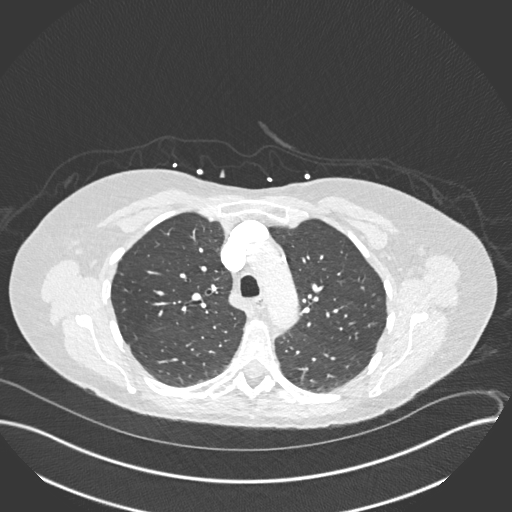
[im 244/317  mediastinal]
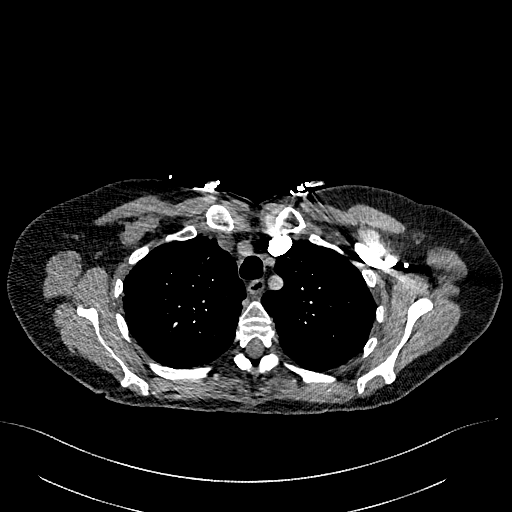
[im 268/317  lung]
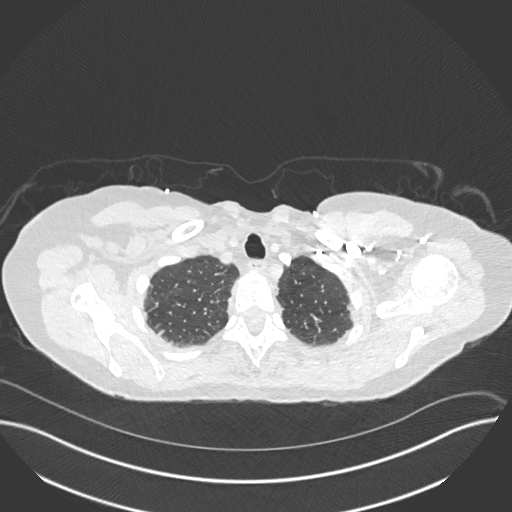
[im 292/317  mediastinal]
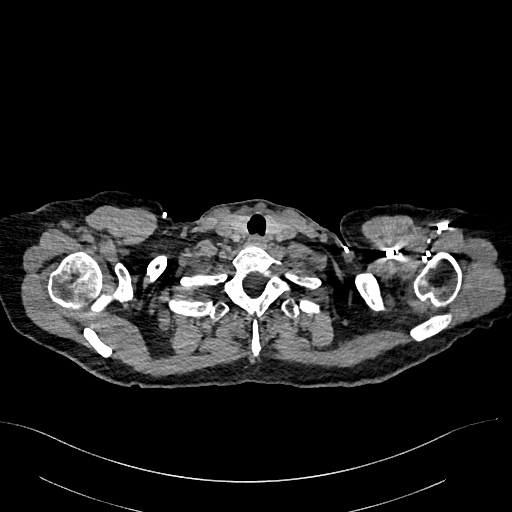

[Series 9: pe 2mm cor · coronal · 0.62mm/px · 1 of 145 slices shown]
[im 73/145  mediastinal]
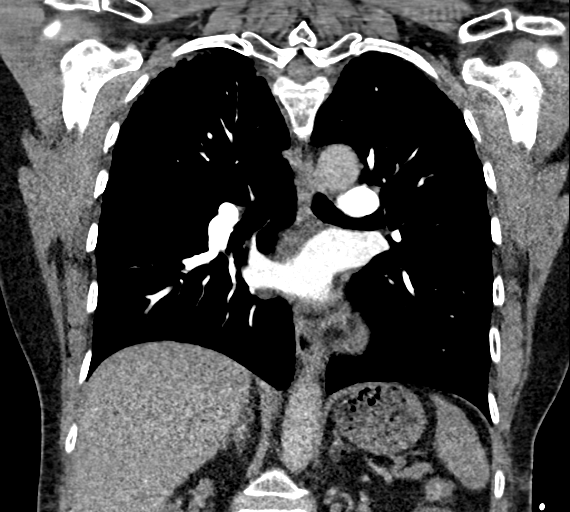

[17 of 36 positions shown; findings below may reference images not displayed]

FINDINGS: Cardiovascular: There are no filling defects within the pulmonary
arteries to suggest pulmonary embolus. Thoracic aorta is normal in
caliber, no periaortic stranding to suggest dissection. The heart is
normal in size. Minimal pericardial fluid may be physiologic or
small effusion.

Mediastinum/Nodes: No enlarged mediastinal or hilar lymph nodes. The
esophagus is decompressed. No visualized thyroid nodule.

Lungs/Pleura: Subsegmental atelectasis or scarring in the lingula.
Mild biapical pleuroparenchymal scarring. No consolidation or
pulmonary edema. No pleural fluid. No pulmonary mass or suspicious
nodule. Trachea mainstem bronchi are patent.

Upper Abdomen: Hepatic cysts, characterized on 12/14/2010 abdominal
MRI. No acute findings.

Musculoskeletal: There are no acute or suspicious osseous
abnormalities.

Review of the MIP images confirms the above findings.
IMPRESSION: 1. No pulmonary embolus or acute intrathoracic abnormality.
2. Subsegmental atelectasis or scarring in the lingula, mild
biapical pleuroparenchymal scarring.

## 2019-11-20 IMAGING — CR DG CHEST 2V
2 series · 2 of 2 positions shown · non-contrast
Comparison: None.

CLINICAL DATA: Chest pain.

EXAM:
CHEST - 2 VIEW

[chest pa]
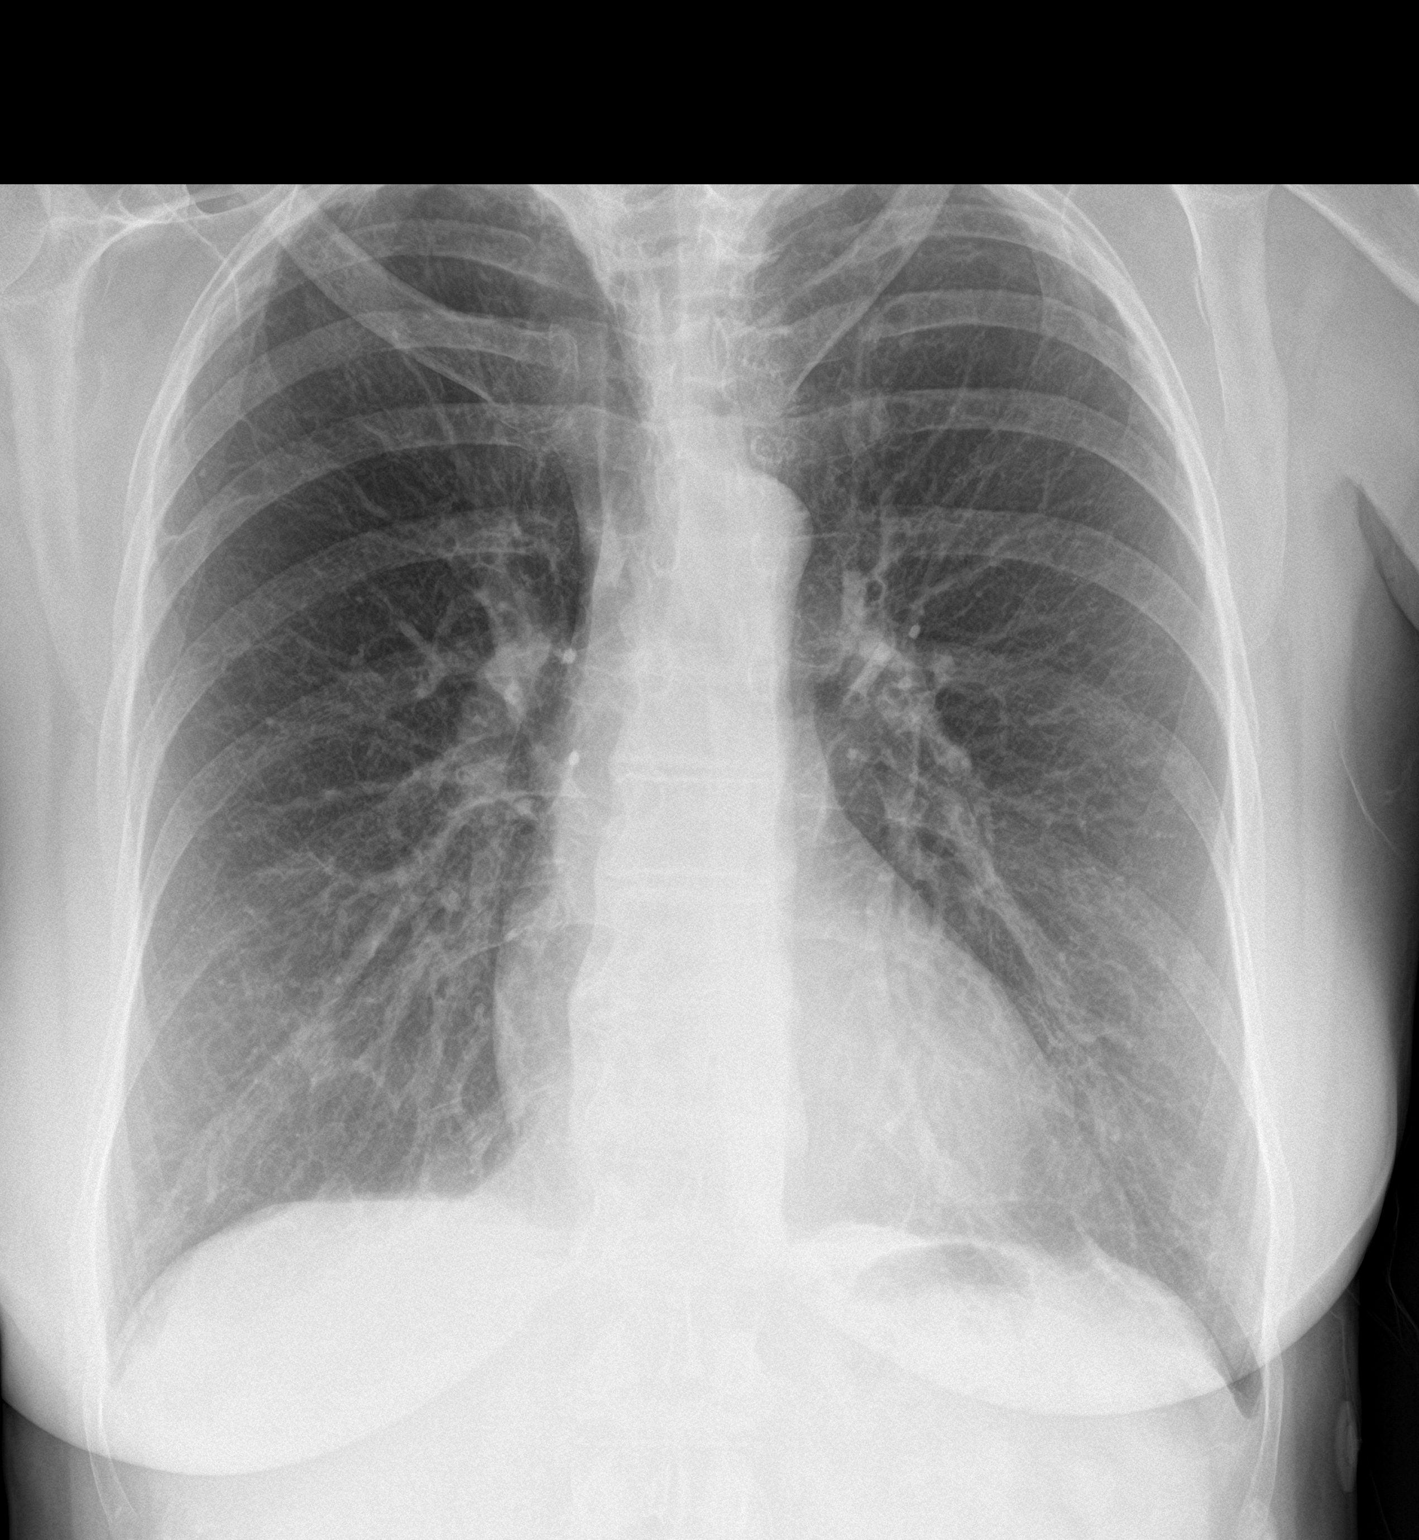

[chest lat]
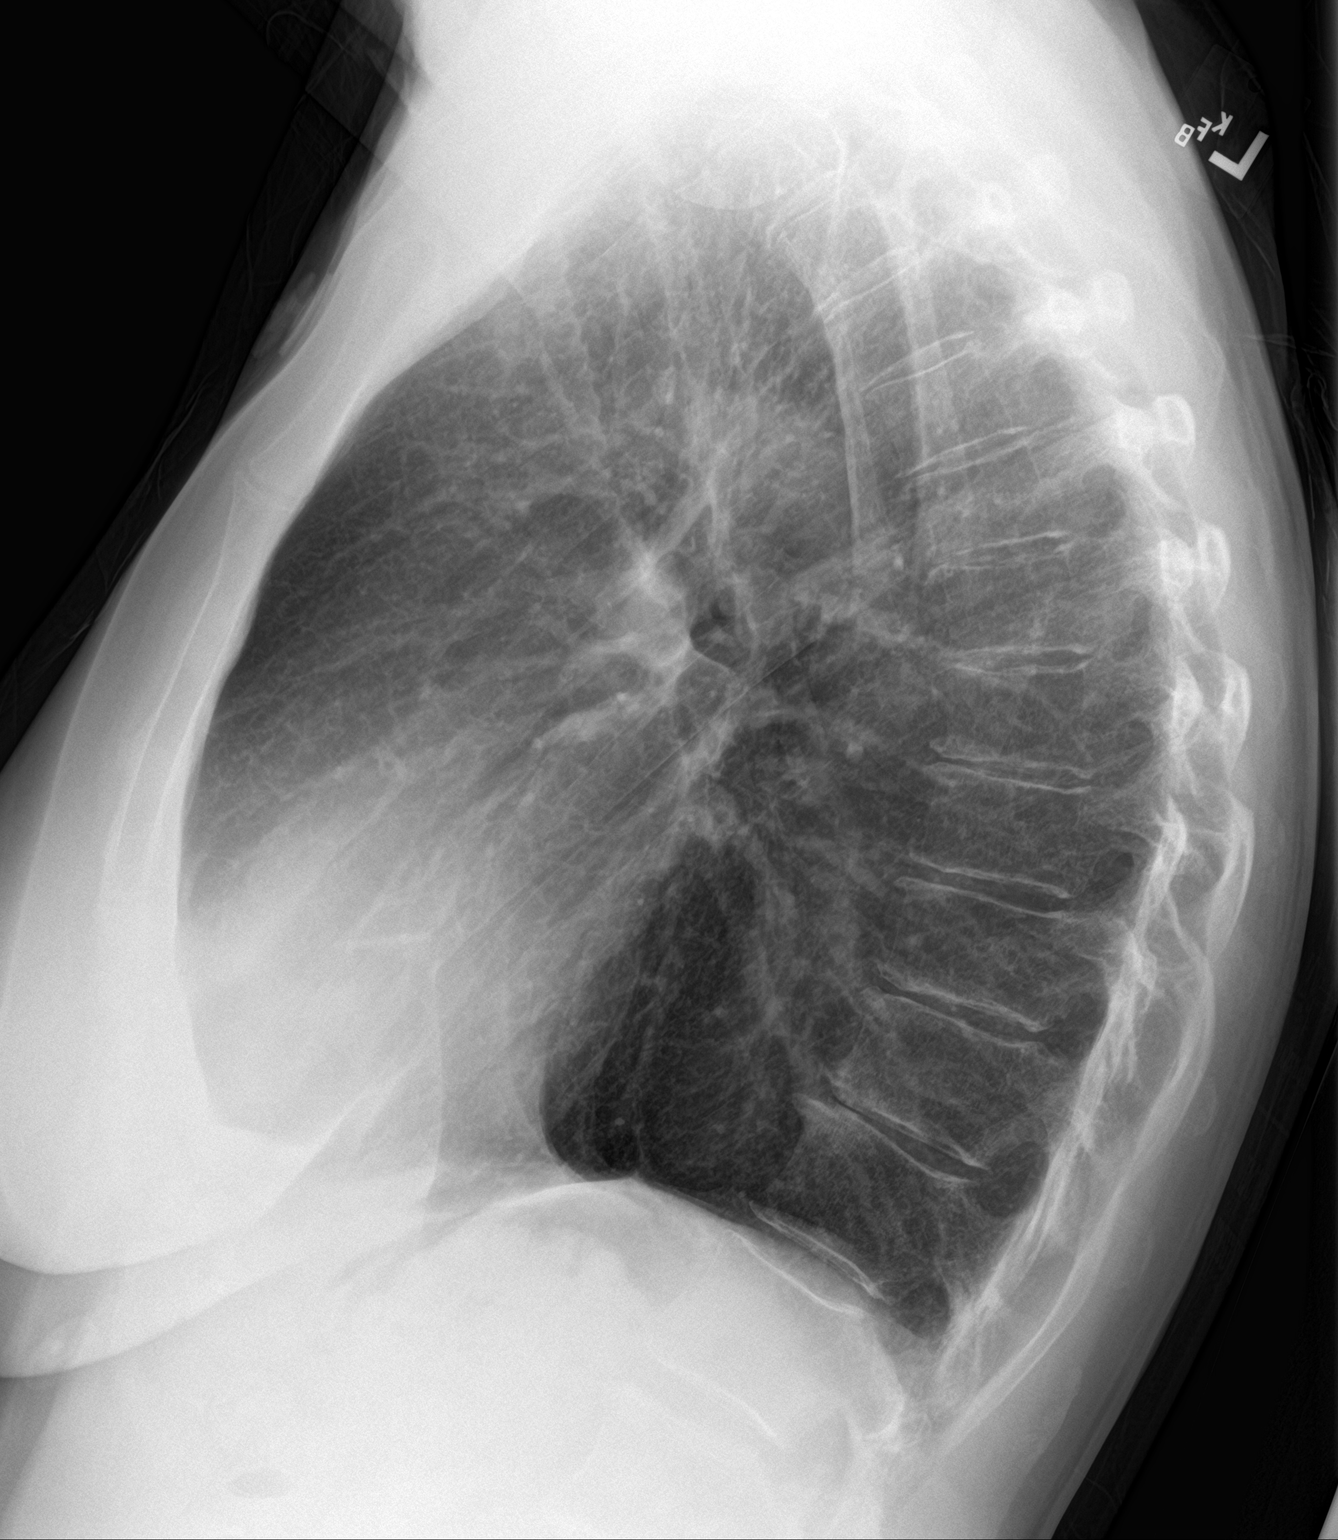

[2 of 2 positions shown; findings below may reference images not displayed]

FINDINGS: The cardiomediastinal contours are normal. Lingular subsegmental
atelectasis or scarring. Biapical pleuroparenchymal scarring.
Pulmonary vasculature is normal. No consolidation, pleural effusion,
or pneumothorax. No acute osseous abnormalities are seen.
IMPRESSION: Lingular subsegmental atelectasis or scarring. Biapical
pleuroparenchymal scarring. No acute pulmonary process.

## 2020-01-14 ENCOUNTER — Ambulatory Visit: Payer: Commercial Managed Care - PPO

## 2020-11-10 DIAGNOSIS — M79642 Pain in left hand: Secondary | ICD-10-CM | POA: Insufficient documentation

## 2021-12-05 ENCOUNTER — Other Ambulatory Visit: Payer: Self-pay | Admitting: Family Medicine

## 2021-12-05 DIAGNOSIS — Z1231 Encounter for screening mammogram for malignant neoplasm of breast: Secondary | ICD-10-CM

## 2022-01-16 ENCOUNTER — Ambulatory Visit
Admission: RE | Admit: 2022-01-16 | Discharge: 2022-01-16 | Disposition: A | Discharge status: Home / Self Care | Payer: Commercial Managed Care - PPO | Source: Ambulatory Visit | Attending: Family Medicine | Admitting: Family Medicine

## 2022-01-16 DIAGNOSIS — Z1231 Encounter for screening mammogram for malignant neoplasm of breast: Secondary | ICD-10-CM

## 2022-02-16 DIAGNOSIS — H43813 Vitreous degeneration, bilateral: Secondary | ICD-10-CM | POA: Insufficient documentation

## 2022-02-16 DIAGNOSIS — H35371 Puckering of macula, right eye: Secondary | ICD-10-CM | POA: Insufficient documentation

## 2022-07-10 DIAGNOSIS — M436 Torticollis: Secondary | ICD-10-CM | POA: Diagnosis not present

## 2022-07-10 DIAGNOSIS — R42 Dizziness and giddiness: Secondary | ICD-10-CM | POA: Diagnosis not present

## 2022-07-10 DIAGNOSIS — R202 Paresthesia of skin: Secondary | ICD-10-CM | POA: Diagnosis not present

## 2022-08-14 DIAGNOSIS — N289 Disorder of kidney and ureter, unspecified: Secondary | ICD-10-CM | POA: Diagnosis not present

## 2022-08-20 DIAGNOSIS — R200 Anesthesia of skin: Secondary | ICD-10-CM | POA: Diagnosis not present

## 2022-09-07 DIAGNOSIS — H35371 Puckering of macula, right eye: Secondary | ICD-10-CM | POA: Diagnosis not present

## 2022-09-07 DIAGNOSIS — H43813 Vitreous degeneration, bilateral: Secondary | ICD-10-CM | POA: Diagnosis not present

## 2022-09-07 DIAGNOSIS — H318 Other specified disorders of choroid: Secondary | ICD-10-CM | POA: Diagnosis not present

## 2022-09-07 DIAGNOSIS — H2513 Age-related nuclear cataract, bilateral: Secondary | ICD-10-CM | POA: Diagnosis not present

## 2022-09-24 DIAGNOSIS — H35371 Puckering of macula, right eye: Secondary | ICD-10-CM | POA: Diagnosis not present

## 2022-09-24 DIAGNOSIS — H318 Other specified disorders of choroid: Secondary | ICD-10-CM | POA: Diagnosis not present

## 2022-09-24 DIAGNOSIS — H43813 Vitreous degeneration, bilateral: Secondary | ICD-10-CM | POA: Diagnosis not present

## 2022-09-24 DIAGNOSIS — H2513 Age-related nuclear cataract, bilateral: Secondary | ICD-10-CM | POA: Diagnosis not present

## 2022-10-01 DIAGNOSIS — H269 Unspecified cataract: Secondary | ICD-10-CM | POA: Diagnosis not present

## 2022-10-01 DIAGNOSIS — H318 Other specified disorders of choroid: Secondary | ICD-10-CM | POA: Diagnosis not present

## 2022-10-01 DIAGNOSIS — H3561 Retinal hemorrhage, right eye: Secondary | ICD-10-CM | POA: Diagnosis not present

## 2022-10-01 DIAGNOSIS — H43813 Vitreous degeneration, bilateral: Secondary | ICD-10-CM | POA: Diagnosis not present

## 2022-10-15 DIAGNOSIS — H318 Other specified disorders of choroid: Secondary | ICD-10-CM | POA: Diagnosis not present

## 2022-11-20 DIAGNOSIS — M542 Cervicalgia: Secondary | ICD-10-CM | POA: Diagnosis not present

## 2022-11-20 DIAGNOSIS — M792 Neuralgia and neuritis, unspecified: Secondary | ICD-10-CM | POA: Diagnosis not present

## 2022-11-30 ENCOUNTER — Encounter: Payer: Self-pay | Admitting: Neurology

## 2022-12-04 ENCOUNTER — Other Ambulatory Visit: Payer: Self-pay | Admitting: Family Medicine

## 2022-12-04 DIAGNOSIS — Z1231 Encounter for screening mammogram for malignant neoplasm of breast: Secondary | ICD-10-CM

## 2022-12-06 DIAGNOSIS — H43813 Vitreous degeneration, bilateral: Secondary | ICD-10-CM | POA: Diagnosis not present

## 2022-12-10 NOTE — Progress Notes (Unsigned)
Initial neurology clinic note  SERVICE DATE: 12/11/22  Reason for Evaluation: Consultation requested by Gaynelle Arabian, MD for an opinion regarding left neck pain and left arm numbness. My final recommendations will be communicated back to the requesting physician by way of shared medical record or letter to requesting physician via [place] mail.  HPI: This is Ms. Angie Elliott, a 67 y.o. right-handed female with a medical history of lumbar spondylosis s/p L4-5 surgery (~2012), polyp in her right eye (PCV) s/p laser treatment, ?CKD, anxiety who presents to neurology clinic with the chief complaint of left neck pain and left arm numbness. The patient is alone today.  Patient's symptoms started in July of 2023 when she had pain in her neck and pain going into both arms. The symptoms would come and go. It could wake her up at night. She had tingling and achy pain in her arms. It was throughout both arms. She also had headaches. She saw her PCP who prescribed steroids. She did have resolution of pain sometime after (1-2 weeks). She had an EMG in 08/2022 that was normal.  In mid 10/2022 she was having pain in her left neck that would radiate into her left arm. It was more tingling and numbness in her arm this time instead of achiness. It is worse when she is sitting or sleeping. When she is working out and pressing light weight above her head, she will feel pulling in her arms.  She was seen by Dr. Esmeralda Links who considered MRI cervical spine, but insurance denied.  Patient has chronic left sided sciatica for many years. This is worse when sitting.  Patient is on iron, magnesium, vit E, zinc, B12, vit C, calcium, a multivitamin.  The patient denies symptoms suggestive of oculobulbar weakness including diplopia, ptosis, dysphagia, poor saliva control, dysarthria/dysphonia, impaired mastication, facial weakness/droop.  The patient has not noticed any recent skin rashes nor does she report any  constitutional symptoms like fever, night sweats, anorexia or unintentional weight loss.  EtOH use: few beers on weekends  Restrictive diet? No Family history of neuropathy/myopathy/neurologic disease? no   MEDICATIONS:  Outpatient Encounter Medications as of 12/11/2022  Medication Sig   [DISCONTINUED] acetaminophen (TYLENOL) 500 MG tablet Take 500 mg by mouth every 6 (six) hours as needed for mild pain.  (Patient not taking: Reported on 12/11/2022)   [DISCONTINUED] atorvastatin (LIPITOR) 20 MG tablet Take 1 tablet (20 mg total) by mouth daily at 6 PM. (Patient not taking: Reported on 12/11/2022)   [DISCONTINUED] docusate sodium (COLACE) 100 MG capsule Take 100-300 mg by mouth daily as needed for mild constipation. (Patient not taking: Reported on 12/11/2022)   [DISCONTINUED] ondansetron (ZOFRAN) 4 MG tablet Take 1 tablet (4 mg total) by mouth every 8 (eight) hours as needed for nausea or vomiting. (Patient not taking: Reported on 12/11/2022)   No facility-administered encounter medications on file as of 12/11/2022.    PAST MEDICAL HISTORY: Past Medical History:  Diagnosis Date   Allergy    Anxiety    Cataract     PAST SURGICAL HISTORY: Past Surgical History:  Procedure Laterality Date   BACK SURGERY N/A    DG GREAT TOE LEFT FOOT  05/08/2018   pin put in    knee surgery x 2 Bilateral    LEFT HEART CATH AND CORONARY ANGIOGRAPHY N/A 05/12/2018   Procedure: LEFT HEART CATH AND CORONARY ANGIOGRAPHY;  Surgeon: Lorretta Harp, MD;  Location: [facility];  Service: Cardiovascular;  Laterality:  N/A;    ALLERGIES: No Known Allergies  FAMILY HISTORY: Family History  Problem Relation Age of Onset   Pancreatic cancer Mother    Heart failure Father        valvular heart disease   CAD Father        s/p CABG in 31's   Colon cancer Neg Hx    Esophageal cancer Neg Hx    Rectal cancer Neg Hx    Stomach cancer Neg Hx     SOCIAL HISTORY: Social History   Tobacco Use   Smoking  status: Never   Smokeless tobacco: Never  Vaping Use   Vaping Use: Never used  Substance Use Topics   Alcohol use: Yes    Comment: occasional beer.    Drug use: No   Social History   Social History Narrative   Not on file     OBJECTIVE: PHYSICAL EXAM: BP 121/75   Pulse 68   Ht '5\' 10"'$  (1.778 m)   Wt 177 lb 6.4 oz (80.5 kg)   SpO2 98%   BMI 25.45 kg/m   General: General appearance: Awake and alert. No distress. Cooperative with exam.  Skin: No obvious rash or jaundice. HEENT: Atraumatic. Anicteric. Bilateral trapezius tightness with some tenderness, left > right. Mildly reduced range of motion. Lungs: Non-labored breathing on room air  Extremities: No edema. No obvious deformity.  Musculoskeletal: No obvious joint swelling. Psych: Affect appropriate.  Neurological: Mental Status: Alert. Speech fluent. No pseudobulbar affect Cranial Nerves: CNII: No RAPD. Visual fields grossly intact. CNIII, IV, VI: PERRL. No nystagmus. EOMI. CN V: Facial sensation intact bilaterally to fine touch. CN VII: Facial muscles symmetric and strong. No ptosis at rest. CN VIII: Hearing grossly intact bilaterally. CN IX: No hypophonia. CN X: Palate elevates symmetrically. CN XI: Full strength shoulder shrug bilaterally. CN XII: Tongue protrusion full and midline. No atrophy or fasciculations. No significant dysarthria Motor: Tone is normal.  Individual muscle group testing (MRC grade out of 5):  Movement     Neck flexion 5    Neck extension 5     Right Left   Shoulder abduction 5 5   Shoulder adduction 5 5   Shoulder ext rotation 5 5   Shoulder int rotation 5 5   Elbow flexion 5 5   Elbow extension 5 5   Wrist extension 5 5   Wrist flexion 5 5   Finger abduction - FDI 5 5   Finger abduction - ADM 5 5   Finger extension 5 5   Finger distal flexion - 2/'3 5 5   '$ Finger distal flexion - 4/'5 5 5   '$ Thumb flexion - FPL 5 5   Thumb abduction - APB 5- 5-    Hip flexion 5 5   Hip  extension 5 5   Hip adduction 5 5   Hip abduction 5 5   Knee extension 5 5   Knee flexion 5 5   Dorsiflexion 5 5   Plantarflexion 5 5     Reflexes:  Right Left   Bicep 2+ 2+-3+   Tricep 2+ 2+-3+   BrRad 2+ 2+-3+   Knee 2+-3+ 2+-3+ Cross adductors bilaterally  Ankle 2+ 2+    Pathological Reflexes: Babinski: mute response bilaterally Hoffman: absent bilaterally Troemner: absent bilaterally Sensation: Pinprick: Intact in all extremities Coordination: Intact finger-to- nose-finger bilaterally. Romberg negative. Gait: Able to rise from chair with arms crossed unassisted. Normal, narrow-based gait.   Lab and Test Review: Imaging:  EMG (08/20/22 by Dr. Brien Few):   ASSESSMENT: Angie Elliott is a 67 y.o. female who presents for evaluation of neck pain and left arm numbness and tingling. She has a relevant medical history of lumbar spondylosis s/p L4-5 surgery (~2012), polyp in her right eye (PCV) s/p laser treatment, ?CKD, anxiety. Her neurological examination is pertinent for hyperreflexia (left > right side). Available diagnostic data is significant for EMG in 08/2022 of upper extremities that was normal. The etiology of patient's symptoms is currently unclear. She has neck muscle tightness that is likely contributing to neck pain and could be responsible for prior headaches and shoulder pain. She has hyperreflexia that may reflect cervical stenosis. She also takes zinc which could cause copper deficiency and mimic spine disease. I will work up as below.   PLAN: -Blood work: vit E, copper, zinc -Recommend stop taking zinc for now -MRI cervical spine wo contrast -Physical therapy for neck pain/tightness  -Return to clinic in 3 months  The impression above as well as the plan as outlined below were extensively discussed with the patient who voiced understanding. All questions were answered to their satisfaction.  When available, results of the above investigations and possible  further recommendations will be communicated to the patient via telephone/MyChart. Patient to call office if not contacted after expected testing turnaround time.   Total time spent reviewing records, interview, history/exam, documentation, and coordination of care on day of encounter:  45 min   Thank you for allowing me to participate in patient's care.  If I can answer any additional questions, I would be pleased to do so.  Kai Levins, MD   CC: Alroy Dust, L.Marlou Sa, [address] [place] [address]  CC: Referring provider: Gaynelle Arabian, MD [address] [place],  [place] [address]

## 2022-12-11 ENCOUNTER — Other Ambulatory Visit (INDEPENDENT_AMBULATORY_CARE_PROVIDER_SITE_OTHER): Payer: Medicare Other

## 2022-12-11 ENCOUNTER — Encounter: Payer: Self-pay | Admitting: Neurology

## 2022-12-11 ENCOUNTER — Ambulatory Visit: Payer: Medicare Other | Admitting: Neurology

## 2022-12-11 VITALS — BP 121/75 | HR 68 | Ht 70.0 in | Wt 177.4 lb

## 2022-12-11 DIAGNOSIS — R200 Anesthesia of skin: Secondary | ICD-10-CM

## 2022-12-11 DIAGNOSIS — M47816 Spondylosis without myelopathy or radiculopathy, lumbar region: Secondary | ICD-10-CM

## 2022-12-11 DIAGNOSIS — M542 Cervicalgia: Secondary | ICD-10-CM

## 2022-12-11 DIAGNOSIS — R202 Paresthesia of skin: Secondary | ICD-10-CM

## 2022-12-11 DIAGNOSIS — R292 Abnormal reflex: Secondary | ICD-10-CM | POA: Diagnosis not present

## 2022-12-11 DIAGNOSIS — R29898 Other symptoms and signs involving the musculoskeletal system: Secondary | ICD-10-CM | POA: Diagnosis not present

## 2022-12-11 NOTE — Patient Instructions (Addendum)
I would like to investigate your symptoms further with the following: -Blood work today -MRI cervical spine  I would like you to stop taking zinc for now. When I have your results, I will be in touch and advise further on next steps.  I am referring you for physical therapy for neck pain/tightness.  I would like to see you back in clinic in 3 months or sooner if needed.  The physicians and staff at [facility] are committed to providing excellent care. You may receive a survey requesting feedback about your experience at our office. We strive to receive "very good" responses to the survey questions. If you feel that your experience would prevent you from giving the office a "very good " response, please contact our office to try to remedy the situation. We may be reached at [phone]. Thank you for taking the time out of your busy day to complete the survey.  Kai Levins, MD [facility]

## 2022-12-13 LAB — COPPER, SERUM: Copper: 132 ug/dL (ref 70–175)

## 2022-12-14 LAB — VITAMIN E
Gamma-Tocopherol (Vit E): <1 mg/L (ref <=4.3)
Vitamin E (Alpha Tocopherol): 22.1 mg/L — ABNORMAL HIGH (ref 5.7–19.9)

## 2022-12-14 LAB — ZINC: Zinc: 79 ug/dL (ref 60–130)

## 2022-12-18 ENCOUNTER — Ambulatory Visit: Payer: Medicare Other | Attending: Neurology | Admitting: Physical Therapy

## 2022-12-18 DIAGNOSIS — R200 Anesthesia of skin: Secondary | ICD-10-CM | POA: Insufficient documentation

## 2022-12-18 DIAGNOSIS — M6281 Muscle weakness (generalized): Secondary | ICD-10-CM | POA: Diagnosis not present

## 2022-12-18 DIAGNOSIS — M47816 Spondylosis without myelopathy or radiculopathy, lumbar region: Secondary | ICD-10-CM | POA: Diagnosis not present

## 2022-12-18 DIAGNOSIS — R202 Paresthesia of skin: Secondary | ICD-10-CM | POA: Diagnosis not present

## 2022-12-18 DIAGNOSIS — R293 Abnormal posture: Secondary | ICD-10-CM | POA: Diagnosis not present

## 2022-12-18 DIAGNOSIS — M542 Cervicalgia: Secondary | ICD-10-CM | POA: Insufficient documentation

## 2022-12-18 DIAGNOSIS — R292 Abnormal reflex: Secondary | ICD-10-CM | POA: Diagnosis not present

## 2022-12-18 DIAGNOSIS — R29898 Other symptoms and signs involving the musculoskeletal system: Secondary | ICD-10-CM | POA: Insufficient documentation

## 2022-12-18 NOTE — Therapy (Signed)
OUTPATIENT PHYSICAL THERAPY CERVICAL EVALUATION   Patient Name: Angie Elliott MRN: [id number] DOB:August 21, 1956, 67 y.o., female Today's Date: 12/18/2022  END OF SESSION:  PT End of Session - 12/18/22 1445     Visit Number 1    Number of Visits 5    Date for PT Re-Evaluation 01/17/23    Authorization Type BCBS Medicare    PT Start Time 1402    PT Stop Time 1441    PT Time Calculation (min) 39 min    Activity Tolerance Patient tolerated treatment well    Behavior During Therapy WFL for tasks assessed/performed             Past Medical History:  Diagnosis Date   Allergy    Anxiety    Cataract    Past Surgical History:  Procedure Laterality Date   BACK SURGERY N/A    DG GREAT TOE LEFT FOOT  05/08/2018   pin put in    knee surgery x 2 Bilateral    LEFT HEART CATH AND CORONARY ANGIOGRAPHY N/A 05/12/2018   Procedure: LEFT HEART CATH AND CORONARY ANGIOGRAPHY;  Surgeon: Lorretta Harp, MD;  Location: [place] [facility];  Service: Cardiovascular;  Laterality: N/A;   Patient Active Problem List   Diagnosis Date Noted   Elevated troponin    Chest pain    NSTEMI (non-ST elevated myocardial infarction) ([place])    Age-related nuclear cataract of both eyes 07/06/2017   Idiopathic polypoidal choroidal vasculopathy 07/06/2017   Macular subretinal hemorrhage of right eye 07/06/2017    PCP: Alroy Dust, L.Marlou Sa, MD   REFERRING PROVIDER: Shellia Carwin, MD   REFERRING DIAG: M54.2 (ICD-10-CM) - Cervicalgia M54.2 (ICD-10-CM) - Neck pain R29.2 (ICD-10-CM) - Hyperreflexia R29.898 (ICD-10-CM) - Neck tightness R20.0,R20.2 (ICD-10-CM) - Numbness and tingling of left upper and lower extremity M47.816 (ICD-10-CM) - Lumbar spondylosis    THERAPY DIAG:  Cervicalgia  Abnormal posture  Muscle weakness (generalized)  Rationale for Evaluation and Treatment: Rehabilitation  ONSET DATE:  12/11/2022  SUBJECTIVE:                                                                                                                                                                                                          SUBJECTIVE STATEMENT: Back in July of 2023 would have headaches and tingling down arms. Reports these would wake her up at night. Went to her PCP and had bloodwork done and took a steroid pill. Reports PCP ruled out anything cardiac related. Reports the pain eventually went away. Before Thanksgiving, woke up with  pain down L side of neck and then would go down L side. Went back to his PCP and then they wanted an MRI, but was unable to get one because she needed to have PT first. Then got referred to a neurologist and got referred to PT and also a new referral to get an MRI. Reports neck is not hurting as bad now. Goes to the [facility] to do [employer]. Has not had any numbness/tingling down arms or pain recently (has not had any for about 2 weeks). Went to [employer] yesterday and did some weights overhead and now just feeling a little sore. Reports will get some headaches off and on  PERTINENT HISTORY:  PMH: NSTEMI, lumbar spondylosis s/p L4-5 surgery (~2012)  Pt with L neck pain and L arm numbness   From neurologist's note:  Patient's symptoms started in July of 2023 when she had pain in her neck and pain going into both arms. The symptoms would come and go. It could wake her up at night. She had tingling and achy pain in her arms. It was throughout both arms. She also had headaches. She saw her PCP who prescribed steroids. She did have resolution of pain sometime after (1-2 weeks). She had an EMG in 08/2022 that was normal.   In mid 10/2022 she was having pain in her left neck that would radiate into her left arm. It was more tingling and numbness in her arm this time instead of achiness. It is worse when she is sitting or sleeping. When she is working out and pressing light weight above her head, she will feel pulling in her arms.   She was seen by Dr. Esmeralda Links who  considered MRI cervical spine, but insurance denied.  PAIN:  Are you having pain? No  PRECAUTIONS: None  WEIGHT BEARING RESTRICTIONS: No  FALLS:  Has patient fallen in last 6 months? No  OCCUPATION: Retired, Goes to [employer] or [employer] daily.   PLOF: Independent  PATIENT GOALS: Get rid of some tightness.   NEXT MD VISIT: Neurologist follow up 03/14/23  OBJECTIVE:   DIAGNOSTIC FINDINGS:  Pt referred for MRI of cervical spine   PATIENT SURVEYS:  NDI 6/50 = 12%, Mild disability    COGNITION: Overall cognitive status: Within functional limits for tasks assessed  SENSATION: WFL  POSTURE: rounded shoulders and forward head  PALPATION: TTP R levator scap, R rhomboids, R suboccipitals  TTP L rhomboids, L upper trap, L levator scap, L infraspinatus   Hypomobility and incr pain with mid/upper thoracic spine   CERVICAL ROM:   Active ROM A/PROM (deg) eval  Flexion 65, feels tightness in back of neck  Extension 45  Right lateral flexion 30, tightness on R side  Left lateral flexion 43, tightness on R side  Right rotation 68, little tightness going to the R  Left rotation 62   (Blank rows = not tested)  UPPER EXTREMITY ROM:  Active ROM Right eval Left eval  Shoulder flexion [facility]  Shoulder extension    Shoulder abduction    Shoulder adduction    Shoulder extension    Shoulder internal rotation WFL, pt reports incr tightness in shoulders WFL  Shoulder external rotation [facility]  Elbow flexion    Elbow extension    Wrist flexion    Wrist extension    Wrist ulnar deviation    Wrist radial deviation    Wrist pronation    Wrist supination     (Blank  rows = not tested)  UPPER EXTREMITY MMT:  MMT Right eval Left eval  Shoulder flexion    Shoulder extension    Shoulder abduction 5 5  Shoulder adduction    Shoulder extension    Shoulder internal rotation    Shoulder external rotation    Middle trapezius 4 4  Lower trapezius 4 3+  Elbow flexion  5 5  Elbow extension 5 5  Wrist flexion 5 5  Wrist extension 5 5  Wrist ulnar deviation    Wrist radial deviation    Wrist pronation    Wrist supination    Grip strength     (Blank rows = not tested)  CERVICAL SPECIAL TESTS:  Spurling's test: Negative bilaterally  Hawkins-Kennedy: Negative   TODAY'S TREATMENT:                                                                                                                              DATE: 12/18/22   Educated on levator scap stretch bilat for HEP, performed 2 x 30 seconds. Pt able to tolerate well. Discussed performing 2-3 times, approx. 2 days a day. Pt did not need handout. Pt reporting feeling good from this stretch   PATIENT EDUCATION:  Education details: Clinical findings, POC, potential for dry needling, levator scap stretch to HEP  Person educated: Patient Education method: Explanation, Demonstration, and Verbal cues Education comprehension: verbalized understanding and returned demonstration  HOME EXERCISE PROGRAM: Will provide further at next session  ASSESSMENT:  CLINICAL IMPRESSION:  Patient is a 67 year old female referred to Neuro OPPT for Neck pain.  Pt used to have numbness/tingling down LUE and incr neck pain with radiating pain down arm and into ear, but pt has not had this in a few weeks.  Pt's PMH is significant for: NSTEMI, lumbar spondylosis s/p L4-5 surgery (~2012).  The following deficits were present during the exam: postural abnormalities, decr cervical AROM ,hypomobility of thoracic spine, TTP of upper traps, levator scap, rhomboids bilaterally. Based on NDI, pt scored in the mild disability category. Pt would benefit from skilled PT to address these impairments and functional limitations to maximize functional mobility independence, improve AROM and decr pain/stiffness.    OBJECTIVE IMPAIRMENTS: decreased mobility, decreased ROM, decreased strength, hypomobility, increased fascial restrictions, increased  muscle spasms, impaired flexibility, and postural dysfunction.   ACTIVITY LIMITATIONS: carrying and lifting  PARTICIPATION LIMITATIONS: cleaning and laundry  PERSONAL FACTORS: Past/current experiences and Time since onset of injury/illness/exacerbation are also affecting patient's functional outcome.   REHAB POTENTIAL: Good  CLINICAL DECISION MAKING: Stable/uncomplicated  EVALUATION COMPLEXITY: Low   GOALS: Goals reviewed with patient? Yes  SHORT TERM GOALS: ALL STGS = LTGS   LONG TERM GOALS: Target date: 01/15/2023  Pt will be independent with final HEP in order to build upon functional gains made in therapy Baseline:  Goal status: INITIAL  2.  Pt will decr NDI score to a 2/50 or less in order to  demo decr disability with neck pain. Baseline: 6/50 Goal status: INITIAL  3.  Pt will improve R cervical AROM to at least 40 degrees in order to demo improved functional mobility. Baseline: 30, tightness on R side Goal status: INITIAL  4.  Pt will be able to perform all cervical AROM without reports of increased tightness in order to demo improved mobility.  Baseline:  Goal status: INITIAL    PLAN:  PT FREQUENCY: 1x/week  PT DURATION: 4 weeks  PLANNED INTERVENTIONS: Therapeutic exercises, Therapeutic activity, Neuromuscular re-education, Balance training, Gait training, Patient/Family education, Self Care, Joint mobilization, Dry Needling, Spinal mobilization, Cryotherapy, Moist heat, and Manual therapy  PLAN FOR NEXT SESSION: Initial HEP for upper trap stretch, chest stretches, thoracic mobility, posture exercises. Would pt benefit from dry needling?    Arliss Journey, PT, DPT  12/18/2022, 2:46 PM

## 2022-12-26 ENCOUNTER — Ambulatory Visit: Payer: Medicare Other | Admitting: Physical Therapy

## 2022-12-26 DIAGNOSIS — R200 Anesthesia of skin: Secondary | ICD-10-CM | POA: Diagnosis not present

## 2022-12-26 DIAGNOSIS — R292 Abnormal reflex: Secondary | ICD-10-CM | POA: Diagnosis not present

## 2022-12-26 DIAGNOSIS — R293 Abnormal posture: Secondary | ICD-10-CM

## 2022-12-26 DIAGNOSIS — M6281 Muscle weakness (generalized): Secondary | ICD-10-CM | POA: Diagnosis not present

## 2022-12-26 DIAGNOSIS — R202 Paresthesia of skin: Secondary | ICD-10-CM | POA: Diagnosis not present

## 2022-12-26 DIAGNOSIS — M542 Cervicalgia: Secondary | ICD-10-CM | POA: Diagnosis not present

## 2022-12-26 DIAGNOSIS — M47816 Spondylosis without myelopathy or radiculopathy, lumbar region: Secondary | ICD-10-CM | POA: Diagnosis not present

## 2022-12-26 DIAGNOSIS — R29898 Other symptoms and signs involving the musculoskeletal system: Secondary | ICD-10-CM | POA: Diagnosis not present

## 2022-12-26 NOTE — Therapy (Signed)
OUTPATIENT PHYSICAL THERAPY CERVICAL TREATMENT   Patient Name: Angie Elliott MRN: [id number] DOB:[birth year], 67 y.o., female Today's Date: 12/26/2022  END OF SESSION:  PT End of Session - 12/26/22 1148     Visit Number 2    Number of Visits 5    Date for PT Re-Evaluation 01/17/23    Authorization Type BCBS Medicare    PT Start Time 1148    PT Stop Time 1230    PT Time Calculation (min) 42 min    Activity Tolerance Patient tolerated treatment well    Behavior During Therapy WFL for tasks assessed/performed              Past Medical History:  Diagnosis Date   Allergy    Anxiety    Cataract    Past Surgical History:  Procedure Laterality Date   BACK SURGERY N/A    DG GREAT TOE LEFT FOOT  05/08/2018   pin put in    knee surgery x 2 Bilateral    LEFT HEART CATH AND CORONARY ANGIOGRAPHY N/A 05/12/2018   Procedure: LEFT HEART CATH AND CORONARY ANGIOGRAPHY;  Surgeon: Lorretta Harp, MD;  Location: [facility];  Service: Cardiovascular;  Laterality: N/A;   Patient Active Problem List   Diagnosis Date Noted   Elevated troponin    Chest pain    NSTEMI (non-ST elevated myocardial infarction) ([place])    Age-related nuclear cataract of both eyes 07/06/2017   Idiopathic polypoidal choroidal vasculopathy 07/06/2017   Macular subretinal hemorrhage of right eye 07/06/2017    PCP: Alroy Dust, L.Marlou Sa, MD   REFERRING PROVIDER: Shellia Carwin, MD   REFERRING DIAG: M54.2 (ICD-10-CM) - Cervicalgia M54.2 (ICD-10-CM) - Neck pain R29.2 (ICD-10-CM) - Hyperreflexia R29.898 (ICD-10-CM) - Neck tightness R20.0,R20.2 (ICD-10-CM) - Numbness and tingling of left upper and lower extremity M47.816 (ICD-10-CM) - Lumbar spondylosis    THERAPY DIAG:  Cervicalgia  Abnormal posture  Muscle weakness (generalized)  Rationale for Evaluation and Treatment: Rehabilitation  ONSET DATE:  12/11/2022  SUBJECTIVE:                                                                                                                                                                                                          SUBJECTIVE STATEMENT: Pt reports she has no pain today at rest. Pt reports she did have pain down her R arm the other day but it has since resolved. Pt also states that she has sharp pain in her neck and upper shoulders with certain exercises but the pain does not linger.  Pt reports that the stretches she was given last session were difficult at first but have gotten easier.  PERTINENT HISTORY:  PMH: NSTEMI, lumbar spondylosis s/p L4-5 surgery (~2012)  Pt with L neck pain and L arm numbness   From neurologist's note:  Patient's symptoms started in July of 2023 when she had pain in her neck and pain going into both arms. The symptoms would come and go. It could wake her up at night. She had tingling and achy pain in her arms. It was throughout both arms. She also had headaches. She saw her PCP who prescribed steroids. She did have resolution of pain sometime after (1-2 weeks). She had an EMG in 08/2022 that was normal.   In mid 10/2022 she was having pain in her left neck that would radiate into her left arm. It was more tingling and numbness in her arm this time instead of achiness. It is worse when she is sitting or sleeping. When she is working out and pressing light weight above her head, she will feel pulling in her arms.   She was seen by Dr. Esmeralda Links who considered MRI cervical spine, but insurance denied.  PAIN:  Are you having pain? No  PRECAUTIONS: None  WEIGHT BEARING RESTRICTIONS: No  FALLS:  Has patient fallen in last 6 months? No  OCCUPATION: Retired, Goes to [employer] or [employer] daily.   PLOF: Independent  PATIENT GOALS: Get rid of some tightness.   NEXT MD VISIT: Neurologist follow up 03/14/23  OBJECTIVE:  Objective measures assessed at initial eval unless otherwise indicated:  CERVICAL ROM:   Active ROM A/PROM (deg) eval   Flexion 65, feels tightness in back of neck  Extension 45  Right lateral flexion 30, tightness on R side  Left lateral flexion 43, tightness on R side  Right rotation 68, little tightness going to the R  Left rotation 62   (Blank rows = not tested)  TODAY'S TREATMENT:                                                                                                                              THER EX: Supine on mat table: Thoracic mobilization stretch on towel roll x 15 reps Child's pose 3 x 30 sec each Child's pose with SB 3 x 30 sec each L/R Seated scapular retraction x 15 reps Sidelying open book stretch 3 x 30 sec each direction Attempted thread the needle exercise, no stretch felt in lats  Exercises added to HEP, see bolded below  THER ACT:  Trigger Point Dry-Needling  Treatment instructions: Expect mild to moderate muscle soreness. S/S of pneumothorax if dry needled over a lung field, and to seek immediate medical attention should they occur. Patient verbalized understanding of these instructions and education.  Patient Consent Given: Yes Education handout provided: Yes Muscles treated: L and R suboccipitals Treatment response/outcome: deep ache/muscle twitch; declines UT needling this date due to lung field  Trigger Point Dry Needling  What is Trigger Point Dry Needling (DN)? DN is a physical therapy technique used to treat muscle pain and dysfunction. Specifically, DN helps deactivate muscle trigger points (muscle knots).  A thin filiform needle is used to penetrate the skin and stimulate the underlying trigger point. The goal is for a local twitch response (LTR) to occur and for the trigger point to relax. No medication of any kind is injected during the procedure.   What Does Trigger Point Dry Needling Feel Like?  The procedure feels different for each individual patient. Some patients report that they do not actually feel the needle enter the skin and overall the  process is not painful. Very mild bleeding may occur. However, many patients feel a deep cramping in the muscle in which the needle was inserted. This is the local twitch response.   How Will I feel after the treatment? Soreness is normal, and the onset of soreness may not occur for a few hours. Typically this soreness does not last longer than two days.  Bruising is uncommon, however; ice can be used to decrease any possible bruising.  In rare cases feeling tired or nauseous after the treatment is normal. In addition, your symptoms may get worse before they get better, this period will typically not last longer than 24 hours.   What Can I do After My Treatment? Increase your hydration by drinking more water for the next 24 hours. You may place ice or heat on the areas treated that have become sore, however, do not use heat on inflamed or bruised areas. Heat often brings more relief post needling. You can continue your regular activities, but vigorous activity is not recommended initially after the treatment for 24 hours. DN is best combined with other physical therapy such as strengthening, stretching, and other therapies.    PATIENT EDUCATION:  Education details: TPDN, added to HEP Person educated: Patient Education method: Explanation, Demonstration, Verbal cues, and Handouts Education comprehension: verbalized understanding and returned demonstration  HOME EXERCISE PROGRAM: Access Code: [email or web address] URL: [email or web address] Date: 12/26/2022 Prepared by: Excell Seltzer  Exercises - Gentle Levator Scapulae Stretch  - 1 x daily - 7 x weekly - 1 sets - 5 reps - 30 sec hold - Supine Thoracic Mobilization Towel Roll Vertical with Arm Stretch  - 1 x daily - 7 x weekly - 3 sets - 10 reps - Child's Pose Stretch  - 1 x daily - 7 x weekly - 1 sets - 10 reps - 30 sec hold - Child's Pose with Sidebending  - 1 x daily - 7 x weekly - 1 sets - 10 reps - 30 sec hold - Seated Scapular  Retraction  - 1 x daily - 7 x weekly - 3 sets - 10 reps - Sidelying Open Book  - 1 x daily - 7 x weekly - 1 sets - 10 reps - 30 sec hold  ASSESSMENT:  CLINICAL IMPRESSION: Emphasis of skilled PT session on performing TPDN and adding stretching and strengthening exercises to HEP to address pain, tightness in neck and shoulders, and postural dysfunction. Pt has no increase in pain symptoms following treatment this date. Pt continues to benefit from skilled therapy services to work towards LTG. Continue POC.    OBJECTIVE IMPAIRMENTS: decreased mobility, decreased ROM, decreased strength, hypomobility, increased fascial restrictions, increased muscle spasms, impaired flexibility, and postural dysfunction.   ACTIVITY LIMITATIONS: carrying and lifting  PARTICIPATION LIMITATIONS: cleaning and laundry  PERSONAL FACTORS: Past/current experiences and  Time since onset of injury/illness/exacerbation are also affecting patient's functional outcome.   REHAB POTENTIAL: Good  CLINICAL DECISION MAKING: Stable/uncomplicated  EVALUATION COMPLEXITY: Low   GOALS: Goals reviewed with patient? Yes  SHORT TERM GOALS: ALL STGS = LTGS   LONG TERM GOALS: Target date: 01/15/2023  Pt will be independent with final HEP in order to build upon functional gains made in therapy Baseline:  Goal status: INITIAL  2.  Pt will decr NDI score to a 2/50 or less in order to demo decr disability with neck pain. Baseline: 6/50 Goal status: INITIAL  3.  Pt will improve R cervical AROM to at least 40 degrees in order to demo improved functional mobility. Baseline: 30, tightness on R side Goal status: INITIAL  4.  Pt will be able to perform all cervical AROM without reports of increased tightness in order to demo improved mobility.  Baseline:  Goal status: INITIAL    PLAN:  PT FREQUENCY: 1x/week  PT DURATION: 4 weeks  PLANNED INTERVENTIONS: Therapeutic exercises, Therapeutic activity, Neuromuscular  re-education, Balance training, Gait training, Patient/Family education, Self Care, Joint mobilization, Dry Needling, Spinal mobilization, Cryotherapy, Moist heat, and Manual therapy  PLAN FOR NEXT SESSION: add to HEP, chest stretches (pec stretch?), thoracic mobility (thoracic extension-supine over foam roll or seated in chair), SL thoracic stretch over pillow, posture exercises, chin tucks. Dry needling?   Excell Seltzer, PT, DPT, CSRS  12/26/2022, 12:32 PM

## 2023-01-02 DIAGNOSIS — H2513 Age-related nuclear cataract, bilateral: Secondary | ICD-10-CM | POA: Diagnosis not present

## 2023-01-02 DIAGNOSIS — H43813 Vitreous degeneration, bilateral: Secondary | ICD-10-CM | POA: Diagnosis not present

## 2023-01-02 DIAGNOSIS — H269 Unspecified cataract: Secondary | ICD-10-CM | POA: Diagnosis not present

## 2023-01-02 DIAGNOSIS — H3561 Retinal hemorrhage, right eye: Secondary | ICD-10-CM | POA: Diagnosis not present

## 2023-01-03 ENCOUNTER — Ambulatory Visit: Payer: Medicare Other | Attending: Neurology | Admitting: Physical Therapy

## 2023-01-03 DIAGNOSIS — M6281 Muscle weakness (generalized): Secondary | ICD-10-CM | POA: Insufficient documentation

## 2023-01-03 DIAGNOSIS — M542 Cervicalgia: Secondary | ICD-10-CM | POA: Diagnosis not present

## 2023-01-03 DIAGNOSIS — R293 Abnormal posture: Secondary | ICD-10-CM | POA: Diagnosis not present

## 2023-01-03 NOTE — Therapy (Signed)
OUTPATIENT PHYSICAL THERAPY CERVICAL TREATMENT   Patient Name: Angie Elliott MRN: [id number] DOB:July 25, 1956, 67 y.o., female Today's Date: 01/03/2023  END OF SESSION:  PT End of Session - 01/03/23 1537     Visit Number 3    Number of Visits 5    Date for PT Re-Evaluation 01/17/23    Authorization Type BCBS Medicare    PT Start Time 1093   this therapist ran over with previous patient   PT Stop Time 1615    PT Time Calculation (min) 40 min    Activity Tolerance Patient tolerated treatment well    Behavior During Therapy WFL for tasks assessed/performed               Past Medical History:  Diagnosis Date   Allergy    Anxiety    Cataract    Past Surgical History:  Procedure Laterality Date   BACK SURGERY N/A    DG GREAT TOE LEFT FOOT  05/08/2018   pin put in    knee surgery x 2 Bilateral    LEFT HEART CATH AND CORONARY ANGIOGRAPHY N/A 05/12/2018   Procedure: LEFT HEART CATH AND CORONARY ANGIOGRAPHY;  Surgeon: Lorretta Harp, MD;  Location: [facility];  Service: Cardiovascular;  Laterality: N/A;   Patient Active Problem List   Diagnosis Date Noted   Elevated troponin    Chest pain    NSTEMI (non-ST elevated myocardial infarction) ([place])    Age-related nuclear cataract of both eyes 07/06/2017   Idiopathic polypoidal choroidal vasculopathy 07/06/2017   Macular subretinal hemorrhage of right eye 07/06/2017    PCP: Alroy Dust, L.Marlou Sa, MD   REFERRING PROVIDER: Shellia Carwin, MD   REFERRING DIAG: M54.2 (ICD-10-CM) - Cervicalgia M54.2 (ICD-10-CM) - Neck pain R29.2 (ICD-10-CM) - Hyperreflexia R29.898 (ICD-10-CM) - Neck tightness R20.0,R20.2 (ICD-10-CM) - Numbness and tingling of left upper and lower extremity M47.816 (ICD-10-CM) - Lumbar spondylosis    THERAPY DIAG:  Cervicalgia  Abnormal posture  Muscle weakness (generalized)  Rationale for Evaluation and Treatment: Rehabilitation  ONSET DATE:  12/11/2022  SUBJECTIVE:                                                                                                                                                                                                          SUBJECTIVE STATEMENT: Pt reports she is having more neck pain today 2/10, has an increase in neck pain with cervical flexion and rotation to 4/10. Pt reports she had increased soreness after TPDN, not interested in doing that again.  PERTINENT HISTORY:  PMH: NSTEMI, lumbar spondylosis s/p L4-5 surgery (~2012)  Pt with L neck pain and L arm numbness   From neurologist's note:  Patient's symptoms started in July of 2023 when she had pain in her neck and pain going into both arms. The symptoms would come and go. It could wake her up at night. She had tingling and achy pain in her arms. It was throughout both arms. She also had headaches. She saw her PCP who prescribed steroids. She did have resolution of pain sometime after (1-2 weeks). She had an EMG in 08/2022 that was normal.   In mid 10/2022 she was having pain in her left neck that would radiate into her left arm. It was more tingling and numbness in her arm this time instead of achiness. It is worse when she is sitting or sleeping. When she is working out and pressing light weight above her head, she will feel pulling in her arms.   She was seen by Dr. Esmeralda Links who considered MRI cervical spine, but insurance denied.  PAIN:  Are you having pain? Yes: NPRS scale: 2/10 Pain location: neck and down into shoulders Pain description: soreness Aggravating factors: neck flexion and rotation Relieving factors: not performing head movements  PRECAUTIONS: None  WEIGHT BEARING RESTRICTIONS: No  FALLS:  Has patient fallen in last 6 months? No  OCCUPATION: Retired, Goes to [employer] or [employer] daily.   PLOF: Independent  PATIENT GOALS: Get rid of some tightness.   NEXT MD VISIT: Neurologist follow up 03/14/23  OBJECTIVE:  Objective measures assessed at  initial eval unless otherwise indicated:  CERVICAL ROM:   Active ROM A/PROM (deg) eval  Flexion 65, feels tightness in back of neck  Extension 45  Right lateral flexion 30, tightness on R side  Left lateral flexion 43, tightness on R side  Right rotation 68, little tightness going to the R  Left rotation 62   (Blank rows = not tested)  TODAY'S TREATMENT:                                                                                                                              THER EX: Supine on mat table: PROM pec stretch 10 x 10-15 sec each Chin tucks x 10 reps, increase in pain in posterior region of neck Thoracic extension mobilization over foam roll, too painful Thoracic extension mobilization over towel roll, also too painful due to pressure of towel into back  Sidelying on mat table: Thoracic sidebend stretch over two pillows x 10 reps B, hold for 10-15 sec  Seated in low-back chair: Thoracic extension mobilization over back of chair, x 10 reps  Standing in doorway of treatment room: Doorway pec stretch 3 x 30 sec each B  Added appropriate exercises to HEP, see bolded below   MANUAL THERAPY: Cervical distraction 5 x 45 sec each Cervical distraction with lateral SB 5 x 30 sec each direction Increased tightness noted  in L trap as compared to R trap   PATIENT EDUCATION:  Education details: continue HEP, added to HEP Person educated: Patient Education method: Consulting civil engineer, Demonstration, Verbal cues, and Handouts Education comprehension: verbalized understanding and returned demonstration  HOME EXERCISE PROGRAM: Access Code: [email or web address] URL: [email or web address] Date: 12/26/2022 Prepared by: Excell Seltzer  Exercises - Gentle Levator Scapulae Stretch  - 1 x daily - 7 x weekly - 1 sets - 5 reps - 30 sec hold - Supine Thoracic Mobilization Towel Roll Vertical with Arm Stretch  - 1 x daily - 7 x weekly - 3 sets - 10 reps - Child's Pose Stretch  - 1 x  daily - 7 x weekly - 1 sets - 10 reps - 30 sec hold - Child's Pose with Sidebending  - 1 x daily - 7 x weekly - 1 sets - 10 reps - 30 sec hold - Seated Scapular Retraction  - 1 x daily - 7 x weekly - 3 sets - 10 reps - Sidelying Open Book  - 1 x daily - 7 x weekly - 1 sets - 10 reps - 30 sec hold - Thoracic Sidebending with Towel Roll  - 1 x daily - 7 x weekly - 3 sets - 10 reps - Seated Thoracic Lumbar Extension  - 1 x daily - 7 x weekly - 1 sets - 10 reps - 10-15 sec hold - Doorway Pec Stretch at 60 Elevation  - 1 x daily - 7 x weekly - 1 sets - 10 reps - 30 sec hold   ASSESSMENT:  CLINICAL IMPRESSION: Emphasis of skilled PT session on performing manual therapy and adding stretching exercises to HEP to address ongoing pain. Pt has some relief of symptoms with manual therapy this date and continues to benefit from skilled therapy services to address postural imbalances leading to increased pain and dysfunction. Continue POC.   OBJECTIVE IMPAIRMENTS: decreased mobility, decreased ROM, decreased strength, hypomobility, increased fascial restrictions, increased muscle spasms, impaired flexibility, and postural dysfunction.   ACTIVITY LIMITATIONS: carrying and lifting  PARTICIPATION LIMITATIONS: cleaning and laundry  PERSONAL FACTORS: Past/current experiences and Time since onset of injury/illness/exacerbation are also affecting patient's functional outcome.   REHAB POTENTIAL: Good  CLINICAL DECISION MAKING: Stable/uncomplicated  EVALUATION COMPLEXITY: Low   GOALS: Goals reviewed with patient? Yes  SHORT TERM GOALS: ALL STGS = LTGS   LONG TERM GOALS: Target date: 01/15/2023  Pt will be independent with final HEP in order to build upon functional gains made in therapy Baseline:  Goal status: INITIAL  2.  Pt will decr NDI score to a 2/50 or less in order to demo decr disability with neck pain. Baseline: 6/50 Goal status: INITIAL  3.  Pt will improve R cervical AROM to at least  40 degrees in order to demo improved functional mobility. Baseline: 30, tightness on R side Goal status: INITIAL  4.  Pt will be able to perform all cervical AROM without reports of increased tightness in order to demo improved mobility.  Baseline:  Goal status: INITIAL    PLAN:  PT FREQUENCY: 1x/week  PT DURATION: 4 weeks  PLANNED INTERVENTIONS: Therapeutic exercises, Therapeutic activity, Neuromuscular re-education, Balance training, Gait training, Patient/Family education, Self Care, Joint mobilization, Dry Needling, Spinal mobilization, Cryotherapy, Moist heat, and Manual therapy  PLAN FOR NEXT SESSION: add to HEP, thoracic mobility, posture exercises for strengthening and stability, scap squeezes/rows, cat/cow  Excell Seltzer, PT, DPT, CSRS  01/03/2023, 4:18 PM

## 2023-01-07 ENCOUNTER — Other Ambulatory Visit: Payer: Medicare Other

## 2023-01-09 ENCOUNTER — Encounter: Payer: Self-pay | Admitting: Physical Therapy

## 2023-01-09 ENCOUNTER — Ambulatory Visit: Payer: Medicare Other | Admitting: Physical Therapy

## 2023-01-09 DIAGNOSIS — M6281 Muscle weakness (generalized): Secondary | ICD-10-CM

## 2023-01-09 DIAGNOSIS — M542 Cervicalgia: Secondary | ICD-10-CM

## 2023-01-09 DIAGNOSIS — R293 Abnormal posture: Secondary | ICD-10-CM | POA: Diagnosis not present

## 2023-01-09 NOTE — Therapy (Signed)
OUTPATIENT PHYSICAL THERAPY CERVICAL TREATMENT   Patient Name: Angie Elliott MRN: [id number] DOB:[birth year], 67 y.o., female Today's Date: 01/09/2023  END OF SESSION:  PT End of Session - 01/09/23 1318     Visit Number 4    Number of Visits 5    Date for PT Re-Evaluation 01/17/23    Authorization Type BCBS Medicare    PT Start Time 9678    PT Stop Time 1358    PT Time Calculation (min) 41 min    Activity Tolerance Patient tolerated treatment well    Behavior During Therapy WFL for tasks assessed/performed               Past Medical History:  Diagnosis Date   Allergy    Anxiety    Cataract    Past Surgical History:  Procedure Laterality Date   BACK SURGERY N/A    DG GREAT TOE LEFT FOOT  05/08/2018   pin put in    knee surgery x 2 Bilateral    LEFT HEART CATH AND CORONARY ANGIOGRAPHY N/A 05/12/2018   Procedure: LEFT HEART CATH AND CORONARY ANGIOGRAPHY;  Surgeon: Lorretta Harp, MD;  Location: [facility];  Service: Cardiovascular;  Laterality: N/A;   Patient Active Problem List   Diagnosis Date Noted   Elevated troponin    Chest pain    NSTEMI (non-ST elevated myocardial infarction) ([place])    Age-related nuclear cataract of both eyes 07/06/2017   Idiopathic polypoidal choroidal vasculopathy 07/06/2017   Macular subretinal hemorrhage of right eye 07/06/2017    PCP: Alroy Dust, L.Marlou Sa, MD   REFERRING PROVIDER: Shellia Carwin, MD   REFERRING DIAG: M54.2 (ICD-10-CM) - Cervicalgia M54.2 (ICD-10-CM) - Neck pain R29.2 (ICD-10-CM) - Hyperreflexia R29.898 (ICD-10-CM) - Neck tightness R20.0,R20.2 (ICD-10-CM) - Numbness and tingling of left upper and lower extremity M47.816 (ICD-10-CM) - Lumbar spondylosis    THERAPY DIAG:  Cervicalgia  Abnormal posture  Muscle weakness (generalized)  Rationale for Evaluation and Treatment: Rehabilitation  ONSET DATE:  12/11/2022  SUBJECTIVE:                                                                                                                                                                                                          SUBJECTIVE STATEMENT:  Has not had any symptoms down her L arm. Has been doing her exercises daily. Feels sore afterwards, but overall reports they are helping. Reports 0/10 pain today.    PERTINENT HISTORY:  PMH: NSTEMI, lumbar spondylosis s/p L4-5 surgery (~2012)  Pt with L neck pain  and L arm numbness   From neurologist's note:  Patient's symptoms started in July of 2023 when she had pain in her neck and pain going into both arms. The symptoms would come and go. It could wake her up at night. She had tingling and achy pain in her arms. It was throughout both arms. She also had headaches. She saw her PCP who prescribed steroids. She did have resolution of pain sometime after (1-2 weeks). She had an EMG in 08/2022 that was normal.   In mid 10/2022 she was having pain in her left neck that would radiate into her left arm. It was more tingling and numbness in her arm this time instead of achiness. It is worse when she is sitting or sleeping. When she is working out and pressing light weight above her head, she will feel pulling in her arms.   She was seen by Dr. Esmeralda Links who considered MRI cervical spine, but insurance denied.  PAIN:  Are you having pain? No  PRECAUTIONS: None  WEIGHT BEARING RESTRICTIONS: No  FALLS:  Has patient fallen in last 6 months? No  OCCUPATION: Retired, Goes to [employer] or [employer] daily.   PLOF: Independent  PATIENT GOALS: Get rid of some tightness.   NEXT MD VISIT: Neurologist follow up 03/14/23  OBJECTIVE:  Objective measures assessed at initial eval unless otherwise indicated:  CERVICAL ROM:   Active ROM A/PROM (deg) eval AROM on 01/09/23  Flexion 65, feels tightness in back of neck 68  Extension 45 47  Right lateral flexion 30, tightness on R side 35  Left lateral flexion 43, tightness on R side 42  Right  rotation 68, little tightness going to the R 75  Left rotation 62 72   (Blank rows = not tested)  TODAY'S TREATMENT:                                                                                                                               THER EX: NuStep at gear 6 for 6 minutes with BLE/BUE for improved circulation/ROM prior to exercises   Quadruped on mat table: Cat/cow x10 reps Thread the needle x5 reps each side   Prone on mat table with forehead resting on towel for improved postural strengthening of mid/lower traps:  T's x10 reps W's 2x10 reps Y's x10 reps   Supine on mat table: With towel roll down pt's spine vertically for chest/pec strech, performing 15 reps of horizontal ABD with yellow tband   Resisted scapular retraction with green band in standing 2 x 10 reps   Seated in chair:Cervical retraction x15 reps against wall, pt with no incr in pain      PATIENT EDUCATION:  Education details: continue HEP, improvements in AROM, adding resisted scap retraction to HEP  Person educated: Patient Education method: Customer service manager Education comprehension: verbalized understanding and returned demonstration  HOME EXERCISE PROGRAM: Access Code: [email or web address] URL: [email or web address] Date: 12/26/2022  Prepared by: Excell Seltzer  Exercises - Gentle Levator Scapulae Stretch  - 1 x daily - 7 x weekly - 1 sets - 5 reps - 30 sec hold - Supine Thoracic Mobilization Towel Roll Vertical with Arm Stretch  - 1 x daily - 7 x weekly - 3 sets - 10 reps - Child's Pose Stretch  - 1 x daily - 7 x weekly - 1 sets - 10 reps - 30 sec hold - Child's Pose with Sidebending  - 1 x daily - 7 x weekly - 1 sets - 10 reps - 30 sec hold - Seated Scapular Retraction  - 1 x daily - 7 x weekly - 3 sets - 10 reps - Sidelying Open Book  - 1 x daily - 7 x weekly - 1 sets - 10 reps - 30 sec hold - Thoracic Sidebending with Towel Roll  - 1 x daily - 7 x weekly - 3 sets - 10  reps - Seated Thoracic Lumbar Extension  - 1 x daily - 7 x weekly - 1 sets - 10 reps - 10-15 sec hold - Doorway Pec Stretch at 60 Elevation  - 1 x daily - 7 x weekly - 1 sets - 10 reps - 30 sec hold   ASSESSMENT:  CLINICAL IMPRESSION: Today's skilled session continued to focus on exercises to improved thoracic and neck mobility and postural strengthening. Pt with no reports of pain during session. Assessed pt's cervical AROM and pt reporting no pain/tightness with any position and pt has met LTG #4. Discussed likely D/C at next session due to progress with pt in agreement with plan. Continue POC.   OBJECTIVE IMPAIRMENTS: decreased mobility, decreased ROM, decreased strength, hypomobility, increased fascial restrictions, increased muscle spasms, impaired flexibility, and postural dysfunction.   ACTIVITY LIMITATIONS: carrying and lifting  PARTICIPATION LIMITATIONS: cleaning and laundry  PERSONAL FACTORS: Past/current experiences and Time since onset of injury/illness/exacerbation are also affecting patient's functional outcome.   REHAB POTENTIAL: Good  CLINICAL DECISION MAKING: Stable/uncomplicated  EVALUATION COMPLEXITY: Low   GOALS: Goals reviewed with patient? Yes  SHORT TERM GOALS: ALL STGS = LTGS   LONG TERM GOALS: Target date: 01/15/2023  Pt will be independent with final HEP in order to build upon functional gains made in therapy Baseline:  Goal status: INITIAL  2.  Pt will decr NDI score to a 2/50 or less in order to demo decr disability with neck pain. Baseline: 6/50 Goal status: INITIAL  3.  Pt will improve R cervical AROM to at least 40 degrees in order to demo improved functional mobility. Baseline: 30, tightness on R side  Improved to 35 degrees  Goal status: NOT MET  4.  Pt will be able to perform all cervical AROM without reports of increased tightness in order to demo improved mobility.  Baseline:  Goal status: MET    PLAN:  PT FREQUENCY:  1x/week  PT DURATION: 4 weeks  PLANNED INTERVENTIONS: Therapeutic exercises, Therapeutic activity, Neuromuscular re-education, Balance training, Gait training, Patient/Family education, Self Care, Joint mobilization, Dry Needling, Spinal mobilization, Cryotherapy, Moist heat, and Manual therapy  PLAN FOR NEXT SESSION: add to HEP, thoracic mobility, posture exercises for strengthening and stability, scap squeezes/rows, cat/cow  Arliss Journey, PT, DPT 01/09/2023, 3:43 PM

## 2023-01-10 ENCOUNTER — Ambulatory Visit: Payer: Commercial Managed Care - PPO | Admitting: Neurology

## 2023-01-14 DIAGNOSIS — H3561 Retinal hemorrhage, right eye: Secondary | ICD-10-CM | POA: Diagnosis not present

## 2023-01-15 ENCOUNTER — Ambulatory Visit: Payer: Medicare Other | Admitting: Physical Therapy

## 2023-01-15 ENCOUNTER — Encounter: Payer: Self-pay | Admitting: Physical Therapy

## 2023-01-15 DIAGNOSIS — M6281 Muscle weakness (generalized): Secondary | ICD-10-CM

## 2023-01-15 DIAGNOSIS — M542 Cervicalgia: Secondary | ICD-10-CM | POA: Diagnosis not present

## 2023-01-15 DIAGNOSIS — R293 Abnormal posture: Secondary | ICD-10-CM

## 2023-01-15 NOTE — Therapy (Signed)
OUTPATIENT PHYSICAL THERAPY CERVICAL TREATMENT   Patient Name: Angie Elliott MRN: [id number] DOB:05/11/1956, 67 y.o., female Today's Date: 01/15/2023  END OF SESSION:  PT End of Session - 01/15/23 1315     Visit Number 5    Number of Visits 5    Date for PT Re-Evaluation 01/17/23    Authorization Type BCBS Medicare    PT Start Time [id number]    PT Stop Time 1329   full time not used due to D/C visit   PT Time Calculation (min) 16 min    Activity Tolerance Patient tolerated treatment well    Behavior During Therapy [facility] for tasks assessed/performed               Past Medical History:  Diagnosis Date   Allergy    Anxiety    Cataract    Past Surgical History:  Procedure Laterality Date   BACK SURGERY N/A    DG GREAT TOE LEFT FOOT  05/08/2018   pin put in    knee surgery x 2 Bilateral    LEFT HEART CATH AND CORONARY ANGIOGRAPHY N/A 05/12/2018   Procedure: LEFT HEART CATH AND CORONARY ANGIOGRAPHY;  Surgeon: Lorretta Harp, MD;  Location: [facility];  Service: Cardiovascular;  Laterality: N/A;   Patient Active Problem List   Diagnosis Date Noted   Elevated troponin    Chest pain    NSTEMI (non-ST elevated myocardial infarction) ([place])    Age-related nuclear cataract of both eyes 07/06/2017   Idiopathic polypoidal choroidal vasculopathy 07/06/2017   Macular subretinal hemorrhage of right eye 07/06/2017    PCP: Alroy Dust, L.Marlou Sa, MD   REFERRING PROVIDER: Shellia Carwin, MD   REFERRING DIAG: M54.2 (ICD-10-CM) - Cervicalgia M54.2 (ICD-10-CM) - Neck pain R29.2 (ICD-10-CM) - Hyperreflexia R29.898 (ICD-10-CM) - Neck tightness R20.0,R20.2 (ICD-10-CM) - Numbness and tingling of left upper and lower extremity M47.816 (ICD-10-CM) - Lumbar spondylosis    THERAPY DIAG:  Cervicalgia  Abnormal posture  Muscle weakness (generalized)  Rationale for Evaluation and Treatment: Rehabilitation  ONSET DATE:  12/11/2022  SUBJECTIVE:                                                                                                                                                                                                          SUBJECTIVE STATEMENT:  No pain today. Exercises are going well, doing them everyday.    PERTINENT HISTORY:  PMH: NSTEMI, lumbar spondylosis s/p L4-5 surgery (~2012)  Pt with L neck pain and L arm numbness   From neurologist's note:  Patient's symptoms started in July of 2023 when she had pain in her neck and pain going into both arms. The symptoms would come and go. It could wake her up at night. She had tingling and achy pain in her arms. It was throughout both arms. She also had headaches. She saw her PCP who prescribed steroids. She did have resolution of pain sometime after (1-2 weeks). She had an EMG in 08/2022 that was normal.   In mid 10/2022 she was having pain in her left neck that would radiate into her left arm. It was more tingling and numbness in her arm this time instead of achiness. It is worse when she is sitting or sleeping. When she is working out and pressing light weight above her head, she will feel pulling in her arms.   She was seen by Dr. Esmeralda Links who considered MRI cervical spine, but insurance denied.  PAIN:  Are you having pain? No  PRECAUTIONS: None  WEIGHT BEARING RESTRICTIONS: No  FALLS:  Has patient fallen in last 6 months? No  OCCUPATION: Retired, Goes to [employer] or [employer] daily.   PLOF: Independent  PATIENT GOALS: Get rid of some tightness.   NEXT MD VISIT: Neurologist follow up 03/14/23  OBJECTIVE:  Objective measures assessed at initial eval unless otherwise indicated:  CERVICAL ROM:   Active ROM A/PROM (deg) eval AROM on 01/09/23  Flexion 65, feels tightness in back of neck 68  Extension 45 47  Right lateral flexion 30, tightness on R side 35  Left lateral flexion 43, tightness on R side 42  Right rotation 68, little tightness going to the R 75  Left rotation 62  72   (Blank rows = not tested)  TODAY'S TREATMENT:                                                                                                                               THERAPEUTIC ACTIVITY:  NDI: 3/50  Reviewed below HEP, pt has been consistently performing and will continue to perform   Access Code: [email or web address] URL: [email or web address] Date: 12/26/2022 Prepared by: Excell Seltzer  Exercises - Gentle Levator Scapulae Stretch  - 1 x daily - 7 x weekly - 1 sets - 5 reps - 30 sec hold - Supine Thoracic Mobilization Towel Roll Vertical with Arm Stretch  - 1 x daily - 7 x weekly - 3 sets - 10 reps - Child's Pose Stretch  - 1 x daily - 7 x weekly - 1 sets - 10 reps - 30 sec hold - Child's Pose with Sidebending  - 1 x daily - 7 x weekly - 1 sets - 10 reps - 30 sec hold - Seated Scapular Retraction  - 1 x daily - 7 x weekly - 3 sets - 10 reps - Sidelying Open Book  - 1 x daily - 7 x weekly - 1 sets - 10 reps - 30 sec hold - Thoracic  Sidebending with Towel Roll  - 1 x daily - 7 x weekly - 3 sets - 10 reps - Seated Thoracic Lumbar Extension  - 1 x daily - 7 x weekly - 1 sets - 10 reps - 10-15 sec hold - Doorway Pec Stretch at 60 Elevation  - 1 x daily - 7 x weekly - 1 sets - 10 reps - 30 sec hold   PATIENT EDUCATION:  Education details: Reviewed HEP, D/C from PT, discussed if neck pain returns in the future then can get a new referral to return  Person educated: Patient Education method: Explanation and Demonstration Education comprehension: verbalized understanding and returned demonstration   PHYSICAL THERAPY DISCHARGE SUMMARY  Visits from Start of Care: 5  Current functional level related to goals / functional outcomes: See clinical assessment statement/LTGs   Remaining deficits: Impaired posture    Education / Equipment: HEP    Patient agrees to discharge. Patient goals were met. Patient is being discharged due to meeting the stated rehab goals. And  being pleased with progress with therapy.    HOME EXERCISE PROGRAM: Access Code: [email or web address] URL: [email or web address] Date: 12/26/2022 Prepared by: Excell Seltzer  Exercises - Gentle Levator Scapulae Stretch  - 1 x daily - 7 x weekly - 1 sets - 5 reps - 30 sec hold - Supine Thoracic Mobilization Towel Roll Vertical with Arm Stretch  - 1 x daily - 7 x weekly - 3 sets - 10 reps - Child's Pose Stretch  - 1 x daily - 7 x weekly - 1 sets - 10 reps - 30 sec hold - Child's Pose with Sidebending  - 1 x daily - 7 x weekly - 1 sets - 10 reps - 30 sec hold - Seated Scapular Retraction  - 1 x daily - 7 x weekly - 3 sets - 10 reps - Sidelying Open Book  - 1 x daily - 7 x weekly - 1 sets - 10 reps - 30 sec hold - Thoracic Sidebending with Towel Roll  - 1 x daily - 7 x weekly - 3 sets - 10 reps - Seated Thoracic Lumbar Extension  - 1 x daily - 7 x weekly - 1 sets - 10 reps - 10-15 sec hold - Doorway Pec Stretch at 60 Elevation  - 1 x daily - 7 x weekly - 1 sets - 10 reps - 30 sec hold   ASSESSMENT:  CLINICAL IMPRESSION: Pt has not had any neck pain in the past couple of weeks and has been performing exercises at home consistently. Pt scored a 3/50 on the NDI today, indicating no disability in regards to neck pain. Pt only has difficulty with lifting heavier objects. Pt with improved AROM compared to eval and pt reporting no pain while performing. Due to progress with PT and pt pleased with current functional level, will D/C from PT. Pt to continue to perform HEP.    OBJECTIVE IMPAIRMENTS: decreased mobility, decreased ROM, decreased strength, hypomobility, increased fascial restrictions, increased muscle spasms, impaired flexibility, and postural dysfunction.   ACTIVITY LIMITATIONS: carrying and lifting  PARTICIPATION LIMITATIONS: cleaning and laundry  PERSONAL FACTORS: Past/current experiences and Time since onset of injury/illness/exacerbation are also affecting patient's functional  outcome.   REHAB POTENTIAL: Good  CLINICAL DECISION MAKING: Stable/uncomplicated  EVALUATION COMPLEXITY: Low   GOALS: Goals reviewed with patient? Yes  SHORT TERM GOALS: ALL STGS = LTGS   LONG TERM GOALS: Target date: 01/15/2023  Pt will be independent with final HEP in order  to build upon functional gains made in therapy Baseline:  Goal status: MET  2.  Pt will decr NDI score to a 2/50 or less in order to demo decr disability with neck pain. Baseline: 6/50; 3/50 on 01/15/23 Goal status: PARTIALLY MET  3.  Pt will improve R cervical AROM to at least 40 degrees in order to demo improved functional mobility. Baseline: 30, tightness on R side  Improved to 35 degrees  Goal status: NOT MET  4.  Pt will be able to perform all cervical AROM without reports of increased tightness in order to demo improved mobility.  Baseline:  Goal status: MET    PLAN:  PT FREQUENCY: 1x/week  PT DURATION: 4 weeks  PLANNED INTERVENTIONS: Therapeutic exercises, Therapeutic activity, Neuromuscular re-education, Balance training, Gait training, Patient/Family education, Self Care, Joint mobilization, Dry Needling, Spinal mobilization, Cryotherapy, Moist heat, and Manual therapy  PLAN FOR NEXT SESSION: D/C   Arliss Journey, PT, DPT 01/15/2023, 1:34 PM

## 2023-01-17 DIAGNOSIS — K080 Exfoliation of teeth due to systemic causes: Secondary | ICD-10-CM | POA: Diagnosis not present

## 2023-01-22 ENCOUNTER — Ambulatory Visit
Admission: RE | Admit: 2023-01-22 | Discharge: 2023-01-22 | Disposition: A | Discharge status: Home / Self Care | Payer: Medicare Other | Source: Ambulatory Visit | Attending: Family Medicine | Admitting: Family Medicine

## 2023-01-22 DIAGNOSIS — Z1231 Encounter for screening mammogram for malignant neoplasm of breast: Secondary | ICD-10-CM | POA: Diagnosis not present

## 2023-01-28 ENCOUNTER — Other Ambulatory Visit: Payer: Self-pay | Admitting: Family Medicine

## 2023-01-28 DIAGNOSIS — N1831 Chronic kidney disease, stage 3a: Secondary | ICD-10-CM | POA: Diagnosis not present

## 2023-01-28 DIAGNOSIS — M810 Age-related osteoporosis without current pathological fracture: Secondary | ICD-10-CM

## 2023-01-28 DIAGNOSIS — R202 Paresthesia of skin: Secondary | ICD-10-CM | POA: Diagnosis not present

## 2023-01-28 DIAGNOSIS — Z13220 Encounter for screening for lipoid disorders: Secondary | ICD-10-CM | POA: Diagnosis not present

## 2023-01-28 DIAGNOSIS — R197 Diarrhea, unspecified: Secondary | ICD-10-CM | POA: Diagnosis not present

## 2023-01-28 DIAGNOSIS — R011 Cardiac murmur, unspecified: Secondary | ICD-10-CM | POA: Diagnosis not present

## 2023-01-28 DIAGNOSIS — I350 Nonrheumatic aortic (valve) stenosis: Secondary | ICD-10-CM | POA: Diagnosis not present

## 2023-01-28 DIAGNOSIS — Z136 Encounter for screening for cardiovascular disorders: Secondary | ICD-10-CM | POA: Diagnosis not present

## 2023-01-28 DIAGNOSIS — Z0000 Encounter for general adult medical examination without abnormal findings: Secondary | ICD-10-CM | POA: Diagnosis not present

## 2023-02-06 DIAGNOSIS — R011 Cardiac murmur, unspecified: Secondary | ICD-10-CM | POA: Diagnosis not present

## 2023-02-06 DIAGNOSIS — I350 Nonrheumatic aortic (valve) stenosis: Secondary | ICD-10-CM | POA: Diagnosis not present

## 2023-02-11 DIAGNOSIS — H35371 Puckering of macula, right eye: Secondary | ICD-10-CM | POA: Diagnosis not present

## 2023-02-26 DIAGNOSIS — R197 Diarrhea, unspecified: Secondary | ICD-10-CM | POA: Diagnosis not present

## 2023-02-26 DIAGNOSIS — R3911 Hesitancy of micturition: Secondary | ICD-10-CM | POA: Diagnosis not present

## 2023-03-06 NOTE — Progress Notes (Signed)
NEUROLOGY FOLLOW UP OFFICE NOTE  Angie Elliott [id number]  Subjective:  Angie Elliott is a 67 y.o. year old right-handed female with a medical history of lumbar spondylosis s/p L4-5 surgery (~2012), polyp in her right eye ([facility]) s/p laser treatment, ?CKD, anxiety who we last saw on 12/11/22.  To briefly review: Patient's symptoms started in July of 2023 when she had pain in her neck and pain going into both arms. The symptoms would come and go. It could wake her up at night. She had tingling and achy pain in her arms. It was throughout both arms. She also had headaches. She saw her PCP who prescribed steroids. She did have resolution of pain sometime after (1-2 weeks). She had an EMG in 08/2022 that was normal.   In mid 10/2022 she was having pain in her left neck that would radiate into her left arm. It was more tingling and numbness in her arm this time instead of achiness. It is worse when she is sitting or sleeping. When she is working out and pressing light weight above her head, she will feel pulling in her arms.   She was seen by Dr. Lewie Chamber who considered MRI cervical spine, but insurance denied.   Patient has chronic left sided sciatica for many years. This is worse when sitting.   Patient is on iron, magnesium, vit E, zinc, B12, vit C, calcium, a multivitamin.   The patient denies symptoms suggestive of oculobulbar weakness including diplopia, ptosis, dysphagia, poor saliva control, dysarthria/dysphonia, impaired mastication, facial weakness/droop.   The patient has not noticed any recent skin rashes nor does she report any constitutional symptoms like fever, night sweats, anorexia or unintentional weight loss.   EtOH use: few beers on weekends  Restrictive diet? No Family history of neuropathy/myopathy/neurologic disease? no  Most recent Assessment and Plan (12/11/22): The etiology of patient's symptoms is currently unclear. She has neck muscle tightness that is likely  contributing to neck pain and could be responsible for prior headaches and shoulder pain. She has hyperreflexia that may reflect cervical stenosis. She also takes zinc which could cause copper deficiency and mimic spine disease. I will work up as below.    PLAN: -Blood work: vit E, copper, zinc -Recommend stop taking zinc for now -MRI cervical spine wo contrast -Physical therapy for neck pain/tightness  Since their last visit: Labs were unremarkable (vit E mildly elevated). MRI cervical spine has not been completed. Per patient, this was denied because she needed to do PT first.  Patient went to PT and had improvement in neck pain. Patient currently has no pain. She has no further tingling or symptoms in her arms as well.   She has no new neurologic complaints. She does mention that she is seeing podiatry for a neuroma in her left foot.  MEDICATIONS:  Outpatient Encounter Medications as of 03/14/2023  Medication Sig   Ascorbic Acid (VITAMIN C) 500 MG CAPS 1 capsule by mouth Once a day   No facility-administered encounter medications on file as of 03/14/2023.    PAST MEDICAL HISTORY: Past Medical History:  Diagnosis Date   Allergy    Anxiety    Cataract     PAST SURGICAL HISTORY: Past Surgical History:  Procedure Laterality Date   BACK SURGERY N/A    DG GREAT TOE LEFT FOOT  05/08/2018   pin put in    knee surgery x 2 Bilateral    LEFT HEART CATH AND CORONARY ANGIOGRAPHY N/A 05/12/2018  Procedure: LEFT HEART CATH AND CORONARY ANGIOGRAPHY;  Surgeon: Runell Gess, MD;  Location: [facility];  Service: Cardiovascular;  Laterality: N/A;    ALLERGIES: No Known Allergies  FAMILY HISTORY: Family History  Problem Relation Age of Onset   Pancreatic cancer Mother    Heart failure Father        valvular heart disease   CAD Father        s/p CABG in 75's   Colon cancer Neg Hx    Esophageal cancer Neg Hx    Rectal cancer Neg Hx    Stomach cancer Neg Hx     SOCIAL  HISTORY: Social History   Tobacco Use   Smoking status: Never   Smokeless tobacco: Never  Vaping Use   Vaping Use: Never used  Substance Use Topics   Alcohol use: Yes    Comment: occasional beer.    Drug use: No   Social History   Social History Narrative   Are you right handed or left handed? Right Hand   Are you currently employed ? No   What is your current occupation?Retired   Do you live at home alone? No   Who lives with you? Husband   What type of home do you live in: 1 story or 2 story? 4 story home          Objective:  Vital Signs:  BP (!) 126/59   Pulse 62   Ht 5\' 10"  (1.778 m)   Wt 179 lb 9.6 oz (81.5 kg)   SpO2 97%   BMI 25.77 kg/m   General: No acute distress.  Patient appears well-groomed.   Head:  Normocephalic/atraumatic Neck: supple, range of motion greatly increased from prior  Neurological Exam: Mental status: alert and oriented, speech fluent and not dysarthric, language intact.  Cranial nerves: CN I: not tested CN II: pupils equal, round and reactive to light, visual fields intact CN III, IV, VI:  full range of motion, no nystagmus, no ptosis CN V: facial sensation intact. CN VII: upper and lower face symmetric CN VIII: hearing intact CN IX, X: uvula midline CN XI: sternocleidomastoid and trapezius muscles intact CN XII: tongue midline  Bulk & Tone: normal, no fasciculations. Motor:  muscle strength 5/5 throughout Deep Tendon Reflexes:  2-3+ throughout.   Sensation:  Sensation intact to light touch. Finger to nose testing:  Without dysmetria.   Gait:  Normal station and stride.  Romberg negative.   Labs and Imaging review: New results: 12/11/22: Vit E: 22.1 (mildly elevated) Copper wnl Zinc wnl  Previously reviewed results: Imaging: EMG (08/20/22 by Dr. Murray Hodgkins):    Assessment/Plan:  This is Angie Elliott, a 67 y.o. female who initially presented in 12/2022 with neck pain that radiated into both arms. She had great  improvement with full resolution of symptoms with PT for neck pain/tightness. She is currently symptom free. She does have possible hyper-reflexia, so we discussed symptoms to watch for that may indicated spinal stenosis.   Plan: -Continue home exercises given by PT -Patient to monitor symptoms and call with new or worsening symptoms, including neck pain, weakness, numbness or tingling, or imbalance  Return to clinic as needed   Jacquelyne Balint, MD

## 2023-03-11 ENCOUNTER — Ambulatory Visit: Payer: Medicare Other | Admitting: Podiatry

## 2023-03-11 ENCOUNTER — Encounter: Payer: Self-pay | Admitting: Podiatry

## 2023-03-11 ENCOUNTER — Ambulatory Visit (INDEPENDENT_AMBULATORY_CARE_PROVIDER_SITE_OTHER): Payer: Medicare Other

## 2023-03-11 VITALS — BP 124/66 | HR 55 | Ht 70.0 in | Wt 177.6 lb

## 2023-03-11 DIAGNOSIS — M778 Other enthesopathies, not elsewhere classified: Secondary | ICD-10-CM

## 2023-03-11 DIAGNOSIS — G5782 Other specified mononeuropathies of left lower limb: Secondary | ICD-10-CM | POA: Diagnosis not present

## 2023-03-11 MED ORDER — TRIAMCINOLONE ACETONIDE 40 MG/ML IJ SUSP
20.0000 mg | Freq: Once | INTRAMUSCULAR | Status: AC
Start: 2023-03-11 — End: 2023-03-11
  Administered 2023-03-11: 20 mg

## 2023-03-11 NOTE — Progress Notes (Signed)
  Subjective:  Patient ID: Angie Elliott, female    DOB: 17-May-1956,  MRN: [id number] HPI Chief Complaint  Patient presents with   Foot Injury    Left foot pain, in between the toes and back of foot, hurting for about six month    67 y.o. female presents with the above complaint.   ROS: Denies fever chills nausea vomit muscle aches pains calf pain back pain chest pain shortness of breath.  Past Medical History:  Diagnosis Date   Allergy    Anxiety    Cataract    Past Surgical History:  Procedure Laterality Date   BACK SURGERY N/A    DG GREAT TOE LEFT FOOT  05/08/2018   pin put in    knee surgery x 2 Bilateral    LEFT HEART CATH AND CORONARY ANGIOGRAPHY N/A 05/12/2018   Procedure: LEFT HEART CATH AND CORONARY ANGIOGRAPHY;  Surgeon: Runell Gess, MD;  Location: [facility];  Service: Cardiovascular;  Laterality: N/A;    Current Outpatient Medications:    Ascorbic Acid (VITAMIN C) 500 MG CAPS, 1 capsule by mouth Once a day, Disp: , Rfl:   No Known Allergies Review of Systems Objective:   Vitals:   03/11/23 0826  BP: 124/66  Pulse: (!) 55  SpO2: 98%    General: Well developed, nourished, in no acute distress, alert and oriented x3   Dermatological: Skin is warm, dry and supple bilateral. Nails x 10 are well maintained; remaining integument appears unremarkable at this time. There are no open sores, no preulcerative lesions, no rash or signs of infection present.  Vascular: Dorsalis Pedis artery and Posterior Tibial artery pedal pulses are 2/4 bilateral with immedate capillary fill time. Pedal hair growth present. No varicosities and no lower extremity edema present bilateral.   Neruologic: Grossly intact via light touch bilateral. Vibratory intact via tuning fork bilateral. Protective threshold with Semmes Wienstein monofilament intact to all pedal sites bilateral. Patellar and Achilles deep tendon reflexes 2+ bilateral. No Babinski or clonus noted  bilateral.  Palpable Mulder's click third interdigital space left the majority of the pain is just distal to the metatarsal phalangeal joints and the digital interspace.  Musculoskeletal: No gross boney pedal deformities bilateral. No pain, crepitus, or limitation noted with foot and ankle range of motion bilateral. Muscular strength 5/5 in all groups tested bilateral.  Gait: Unassisted, Nonantalgic.    Radiographs:  Radiographs taken today demonstrate an osseously mature left foot good mineralization of bone.  No significant osseous abnormalities identified.  No acute findings.  Assessment & Plan:   Assessment: Neuroma third interdigital space left.  Plan: Discussed etiology pathology conservative versus surgical therapies.  At this point I injected the third interdigital space today 10 mg Kenalog 5 mg Marcaine point maximal tenderness.  Tolerated procedure well.  Follow-up with her in a few weeks.     Ayushi Pla T. [place], [place]

## 2023-03-14 ENCOUNTER — Encounter: Payer: Self-pay | Admitting: Neurology

## 2023-03-14 ENCOUNTER — Ambulatory Visit: Payer: Medicare Other | Admitting: Neurology

## 2023-03-14 VITALS — BP 126/59 | HR 62 | Ht 70.0 in | Wt 179.6 lb

## 2023-03-14 DIAGNOSIS — R292 Abnormal reflex: Secondary | ICD-10-CM

## 2023-03-14 DIAGNOSIS — M542 Cervicalgia: Secondary | ICD-10-CM | POA: Diagnosis not present

## 2023-03-14 NOTE — Patient Instructions (Signed)
Continue your home exercises given by physical therapy.  Follow up with me as needed.  The physicians and staff at [facility] are committed to providing excellent care. You may receive a survey requesting feedback about your experience at our office. We strive to receive "very good" responses to the survey questions. If you feel that your experience would prevent you from giving the office a "very good " response, please contact our office to try to remedy the situation. We may be reached at [phone]. Thank you for taking the time out of your busy day to complete the survey.   Jacquelyne Balint, MD [facility]

## 2023-04-05 DIAGNOSIS — H5203 Hypermetropia, bilateral: Secondary | ICD-10-CM | POA: Diagnosis not present

## 2023-04-22 ENCOUNTER — Ambulatory Visit: Payer: Medicare Other | Admitting: Physician Assistant

## 2023-04-23 ENCOUNTER — Encounter: Payer: Self-pay | Admitting: Podiatry

## 2023-04-23 ENCOUNTER — Ambulatory Visit: Payer: Medicare Other | Admitting: Podiatry

## 2023-04-23 DIAGNOSIS — G5782 Other specified mononeuropathies of left lower limb: Secondary | ICD-10-CM | POA: Diagnosis not present

## 2023-04-23 MED ORDER — TRIAMCINOLONE ACETONIDE 40 MG/ML IJ SUSP
20.0000 mg | Freq: Once | INTRAMUSCULAR | Status: AC
Start: 2023-04-23 — End: 2023-04-23
  Administered 2023-04-23: 20 mg

## 2023-04-23 NOTE — Progress Notes (Signed)
She presents today for follow-up of her neuroma third interspace of her left foot states that she really did not see any difference with the shot.  Objective: She still has pain on palpation third interdigital space with palpable Mulder's click left foot.  Assessment: Neuroma most likely third interspace left foot.  Plan: Reinjected today combination of dexamethasone and Kenalog.  If this fails to alleviate her symptomatology MRI would be necessary for differential diagnoses.

## 2023-05-06 DIAGNOSIS — H318 Other specified disorders of choroid: Secondary | ICD-10-CM | POA: Diagnosis not present

## 2023-05-06 DIAGNOSIS — H3561 Retinal hemorrhage, right eye: Secondary | ICD-10-CM | POA: Diagnosis not present

## 2023-05-21 ENCOUNTER — Ambulatory Visit: Payer: Medicare Other | Admitting: Podiatry

## 2023-05-21 ENCOUNTER — Encounter: Payer: Self-pay | Admitting: Podiatry

## 2023-05-21 DIAGNOSIS — G5782 Other specified mononeuropathies of left lower limb: Secondary | ICD-10-CM

## 2023-05-21 DIAGNOSIS — S99922A Unspecified injury of left foot, initial encounter: Secondary | ICD-10-CM

## 2023-05-21 NOTE — Progress Notes (Signed)
She presents today for follow-up of her neuroma third interdigital space of her left foot.  She states that is just not any better try different shoes we have tried injections taking anti-inflammatories have stayed off of my foot nothing seems to be making it better.  Objective: Vital signs are stable she is alert and oriented x 3.  Pulses are palpable.  She has no reproducible pain at this point on palpation to the third interdigital space however she does have pain on palpation to the third metatarsal phalangeal joint and to some degree the fourth metatarsal phalangeal joint particularly with end range of motion she does have some diastases between those 2 toes possible tear of the plantar capsule or a mass occupying lesion.  Assessment mass lesion possible neuroma third interdigital space left foot.  Cannot rule out a bursitis or even a tear of the capsules to the third and fourth metatarsal phalangeal joints.  Plan: Currently requesting MRI for differential diagnoses of the left foot third interdigital space area.  This will be for differential diagnosis and surgical planning.  This is affecting her ability to perform her daily activities and exercise and maintain her general good health.

## 2023-05-24 ENCOUNTER — Other Ambulatory Visit: Payer: Self-pay | Admitting: Podiatry

## 2023-05-25 ENCOUNTER — Ambulatory Visit
Admission: RE | Admit: 2023-05-25 | Discharge: 2023-05-25 | Disposition: A | Discharge status: Home / Self Care | Payer: Medicare Other | Source: Ambulatory Visit | Attending: Podiatry | Admitting: Podiatry

## 2023-05-25 DIAGNOSIS — M79672 Pain in left foot: Secondary | ICD-10-CM | POA: Diagnosis not present

## 2023-05-25 DIAGNOSIS — S99922A Unspecified injury of left foot, initial encounter: Secondary | ICD-10-CM

## 2023-05-25 DIAGNOSIS — G5782 Other specified mononeuropathies of left lower limb: Secondary | ICD-10-CM

## 2023-06-04 ENCOUNTER — Telehealth: Payer: Self-pay | Admitting: *Deleted

## 2023-06-04 NOTE — Telephone Encounter (Signed)
Called patient to update that an overread is needed and there will be a delay of the results,  verbalized understanding.

## 2023-06-04 NOTE — Telephone Encounter (Signed)
-----   Message from Kristian Covey, [facility] sent at 06/04/2023 11:09 AM EDT -----  ----- Message ----- From: Elinor Parkinson, DPM Sent: 06/03/2023  11:57 AM EDT To: Kristian Covey, PMAC  Please send for an overread and notify the patient of the delay.  In the description for the over read please asked him to be specific in the area between the third and fourth toes.

## 2023-06-17 ENCOUNTER — Telehealth: Payer: Self-pay | Admitting: Podiatry

## 2023-06-17 ENCOUNTER — Other Ambulatory Visit: Payer: Medicare Other

## 2023-06-17 NOTE — Telephone Encounter (Signed)
Patient called, requesting an updated status report on that re-read request of her MRI.  She states it's been two weeks and doesn't see anything in her chart.    I didn't see anything either.  Her callback [phone]

## 2023-06-20 ENCOUNTER — Telehealth: Payer: Self-pay | Admitting: Podiatry

## 2023-06-20 NOTE — Telephone Encounter (Signed)
Pt had MRI done and she has not heard anything back, she would like for someone to call back

## 2023-07-01 ENCOUNTER — Telehealth: Payer: Self-pay | Admitting: Podiatry

## 2023-07-01 NOTE — Telephone Encounter (Signed)
Pt is calling to find out the status of her MRI results that were sent for a 2nd opinion. Please advise.

## 2023-07-02 ENCOUNTER — Telehealth: Payer: Self-pay | Admitting: Podiatry

## 2023-07-02 NOTE — Telephone Encounter (Signed)
Pt has called 4 times about MRI results and woulf like a call back as soon as possible

## 2023-07-08 ENCOUNTER — Ambulatory Visit
Admission: RE | Admit: 2023-07-08 | Discharge: 2023-07-08 | Disposition: A | Discharge status: Home / Self Care | Payer: Medicare Other | Source: Ambulatory Visit | Attending: Family Medicine | Admitting: Family Medicine

## 2023-07-08 DIAGNOSIS — E349 Endocrine disorder, unspecified: Secondary | ICD-10-CM | POA: Diagnosis not present

## 2023-07-08 DIAGNOSIS — M810 Age-related osteoporosis without current pathological fracture: Secondary | ICD-10-CM

## 2023-07-08 DIAGNOSIS — N958 Other specified menopausal and perimenopausal disorders: Secondary | ICD-10-CM | POA: Diagnosis not present

## 2023-07-09 ENCOUNTER — Ambulatory Visit: Payer: Medicare Other | Admitting: Podiatry

## 2023-07-09 DIAGNOSIS — G5782 Other specified mononeuropathies of left lower limb: Secondary | ICD-10-CM | POA: Diagnosis not present

## 2023-07-09 NOTE — Progress Notes (Signed)
Angie Elliott presents today for follow-up of her neuroma third interdigital space of her left foot.  At this point she states that is unchanged and seems to be getting worse.  She states that her pain level is a 6 or 7 out of 10.  Objective: Vital signs are stable she is alert oriented x 3 still has pain on palpation to the third interdigital space of the left foot.  I reviewed radiographs and reviewed the MRI we had 1 MRI statement that failed to even mention the intermetatarsal spaces however I did send out for an overread and it did demonstrate a neuroma to the third interdigital space of the left foot.  Assessment: Neuroma third interspace left foot.  Plan: At this point she would like to go ahead and have this removed we did schedule her for a neurectomy third interdigital space left foot.  We discussed the etiology pathology conservative surgical therapies with this surgery she does understand possible complications which may include and not limited to postop pain bleeding swelling infection recurrence need for further surgery overcorrection under correction loss of digit loss of limb loss of life loss of sensation permanently.  We did discuss anesthesia and the amount of time for this to heal which I recommended most likely would be a month and I will follow-up with her in the near future for surgical intervention most likely sometime in October when she returns from her son's wedding in [place] and [address].

## 2023-07-22 DIAGNOSIS — K080 Exfoliation of teeth due to systemic causes: Secondary | ICD-10-CM | POA: Diagnosis not present

## 2023-08-09 DIAGNOSIS — H318 Other specified disorders of choroid: Secondary | ICD-10-CM | POA: Diagnosis not present

## 2023-08-23 DIAGNOSIS — B349 Viral infection, unspecified: Secondary | ICD-10-CM | POA: Diagnosis not present

## 2023-08-23 DIAGNOSIS — Z20822 Contact with and (suspected) exposure to covid-19: Secondary | ICD-10-CM | POA: Diagnosis not present

## 2023-08-23 DIAGNOSIS — J029 Acute pharyngitis, unspecified: Secondary | ICD-10-CM | POA: Diagnosis not present

## 2023-08-28 ENCOUNTER — Telehealth: Payer: Self-pay | Admitting: Podiatry

## 2023-08-28 NOTE — Telephone Encounter (Signed)
DOS- 09/20/2023  NEURECTOMY 3RD IDS [id number]  [employer] EFFECTIVE DATE - 12/03/2022  DEDUCTIBLE- $0.00 WITH REMAINING $0.00 OOP- $3500.00 WITH REMAINING $2879.31  SPOKE WITH ESTER B FROM [employer], SHE STATED THAT PRIOR AUTHORIZATION IS NOT REQUIRED FOR CPT CODE 47829.  CALL REFERENCE NUMBER: [id number] 08/28/2023 @ 9:17AM

## 2023-09-18 ENCOUNTER — Other Ambulatory Visit: Payer: Self-pay | Admitting: Podiatry

## 2023-09-18 MED ORDER — CEPHALEXIN 500 MG PO CAPS: 500.0000 mg | ORAL_CAPSULE | Freq: Three times a day (TID) | ORAL | 0 refills | Status: DC

## 2023-09-18 MED ORDER — OXYCODONE-ACETAMINOPHEN 10-325 MG PO TABS
1.0000 | ORAL_TABLET | Freq: Three times a day (TID) | ORAL | 0 refills | Status: AC | PRN
Start: 2023-09-18 — End: 2023-09-25

## 2023-09-18 MED ORDER — ONDANSETRON HCL 4 MG PO TABS: 4.0000 mg | ORAL_TABLET | Freq: Three times a day (TID) | ORAL | 0 refills | Status: AC | PRN

## 2023-09-20 DIAGNOSIS — G5760 Lesion of plantar nerve, unspecified lower limb: Secondary | ICD-10-CM | POA: Diagnosis not present

## 2023-09-20 DIAGNOSIS — G8918 Other acute postprocedural pain: Secondary | ICD-10-CM | POA: Diagnosis not present

## 2023-09-20 DIAGNOSIS — G5762 Lesion of plantar nerve, left lower limb: Secondary | ICD-10-CM | POA: Diagnosis not present

## 2023-09-26 ENCOUNTER — Ambulatory Visit (INDEPENDENT_AMBULATORY_CARE_PROVIDER_SITE_OTHER): Payer: Medicare Other | Admitting: Podiatry

## 2023-09-26 ENCOUNTER — Encounter: Payer: Self-pay | Admitting: Podiatry

## 2023-09-26 ENCOUNTER — Ambulatory Visit (INDEPENDENT_AMBULATORY_CARE_PROVIDER_SITE_OTHER): Payer: Medicare Other

## 2023-09-26 VITALS — Ht 70.0 in | Wt 179.0 lb

## 2023-09-26 DIAGNOSIS — Z9889 Other specified postprocedural states: Secondary | ICD-10-CM | POA: Diagnosis not present

## 2023-09-26 NOTE — Progress Notes (Signed)
       Chief Complaint  Patient presents with   Routine Post Op    RM16: POV #1, DOS 09/20/2023---EXCISION NEUROMA FOOT 3RD IDS LEFT/ HYATT PT   HPI: 67 y.o. female presents today for her first postoperative appointment following an excision of neuroma left third interspace by Dr. Al Corpus.  Patient denies fever, chills, night sweats.  She has been wearing her pneumatic cam walker.  Has minimal discomfort.  Overall  she notes that she is pleased with the outcome.  Past Medical History:  Diagnosis Date   Allergy    Anxiety    Cataract     Past Surgical History:  Procedure Laterality Date   BACK SURGERY N/A    DG GREAT TOE LEFT FOOT  05/08/2018   pin put in    knee surgery x 2 Bilateral    LEFT HEART CATH AND CORONARY ANGIOGRAPHY N/A 05/12/2018   Procedure: LEFT HEART CATH AND CORONARY ANGIOGRAPHY;  Surgeon: Runell Gess, MD;  Location: [facility];  Service: Cardiovascular;  Laterality: N/A;   No Known Allergies   Physical Exam: Palpable pedal pulses left foot.  Minimal to no edema in the left forefoot.  There is moderate fading ecchymosis to the dorsal left forefoot.  Incision is well-approximated and the sutures are holding well.  No signs of dehiscence or clinical signs of infection.  No active drainage noted.  Some discomfort noted on palpation of the incision area.  Radiographic Exam (left foot, 3 weightbearing views, 09/26/2023):  Normal osseous mineralization. Joint spaces preserved.  No gas seen within the soft tissues in the surgical area.  No osteolysis or periosteal reaction in the adjacent area to the operative area.  Assessment/Plan of Care: 1. Post-operative state     Patient's incision was redressed with Xeroform gauze and a dry sterile dressing was applied.  She will continue with the cam walker.  Informed patient that she should be okay to remove the cam walker for sleeping but do not remove the dressing.  As she would need to get further clarification  from Dr. Al Corpus directly regarding this.  Continue with cam walker at all times weightbearing.  Follow-up as scheduled for postop #2   Clerance Lav, DPM, FACFAS [facility]     [address] [place], [place] [address]                Office [phone]  Fax [phone]

## 2023-10-10 ENCOUNTER — Encounter: Payer: Self-pay | Admitting: Podiatry

## 2023-10-10 ENCOUNTER — Ambulatory Visit (INDEPENDENT_AMBULATORY_CARE_PROVIDER_SITE_OTHER): Payer: Medicare Other | Admitting: Podiatry

## 2023-10-10 DIAGNOSIS — G5782 Other specified mononeuropathies of left lower limb: Secondary | ICD-10-CM

## 2023-10-10 DIAGNOSIS — Z9889 Other specified postprocedural states: Secondary | ICD-10-CM

## 2023-10-10 NOTE — Progress Notes (Signed)
For

## 2023-10-30 ENCOUNTER — Encounter: Payer: Self-pay | Admitting: Podiatry

## 2023-10-30 ENCOUNTER — Ambulatory Visit (INDEPENDENT_AMBULATORY_CARE_PROVIDER_SITE_OTHER): Payer: Medicare Other | Admitting: Podiatry

## 2023-10-30 DIAGNOSIS — Z9889 Other specified postprocedural states: Secondary | ICD-10-CM

## 2023-10-30 DIAGNOSIS — G5782 Other specified mononeuropathies of left lower limb: Secondary | ICD-10-CM

## 2023-10-30 NOTE — Progress Notes (Signed)
  Subjective:  Patient ID: Angie Elliott, female    DOB: [birth year],  MRN: [id number]  No chief complaint on file.   DOS: 09/20/23 Procedure: Left third interspace neuroma excision  67 y.o. female returns for POV #3 . Relates doing well without issue.   Review of Systems: Negative except as noted in the HPI. Denies N/V/F/Ch.  Past Medical History:  Diagnosis Date   Allergy    Anxiety    Cataract     Current Outpatient Medications:    Ascorbic Acid (VITAMIN C) 500 MG CAPS, 1 capsule by mouth Once a day, Disp: , Rfl:    ondansetron (ZOFRAN) 4 MG tablet, Take 1 tablet (4 mg total) by mouth every 8 (eight) hours as needed., Disp: 20 tablet, Rfl: 0  Social History   Tobacco Use  Smoking Status Never  Smokeless Tobacco Never    No Known Allergies Objective:  There were no vitals filed for this visit. There is no height or weight on file to calculate BMI. Constitutional Well developed. Well nourished.  Vascular Foot warm and well perfused. Capillary refill normal to all digits.   Neurologic Normal speech. Oriented to person, place, and time. Epicritic sensation to light touch grossly present bilaterally.  Dermatologic Skin healing well without signs of infection. Skin edges well coapted without signs of infection.  Orthopedic: Tenderness to palpation noted about the surgical site.   Assessment:   1. Post-operative state   2. Neuroma of third interspace of left foot    Plan:  Patient was evaluated and treated and all questions answered.  S/p foot surgery left -Progressing as expected post-operatively. -WB Status: WBAT in regular shoes -Medications:  -Patient doing well and discharged from surgical standpoint.   No follow-ups on file.

## 2023-11-06 ENCOUNTER — Encounter: Payer: Self-pay | Admitting: Podiatry

## 2023-11-15 DIAGNOSIS — H35371 Puckering of macula, right eye: Secondary | ICD-10-CM | POA: Diagnosis not present

## 2023-11-15 DIAGNOSIS — H318 Other specified disorders of choroid: Secondary | ICD-10-CM | POA: Diagnosis not present

## 2023-11-15 DIAGNOSIS — H43813 Vitreous degeneration, bilateral: Secondary | ICD-10-CM | POA: Diagnosis not present

## 2023-12-12 ENCOUNTER — Other Ambulatory Visit: Payer: Self-pay | Admitting: Family Medicine

## 2023-12-12 DIAGNOSIS — Z0000 Encounter for general adult medical examination without abnormal findings: Secondary | ICD-10-CM

## 2023-12-19 DIAGNOSIS — H5203 Hypermetropia, bilateral: Secondary | ICD-10-CM | POA: Diagnosis not present

## 2024-01-24 ENCOUNTER — Ambulatory Visit: Payer: Medicare Other

## 2024-01-27 DIAGNOSIS — E7800 Pure hypercholesterolemia, unspecified: Secondary | ICD-10-CM | POA: Diagnosis not present

## 2024-01-27 DIAGNOSIS — N1831 Chronic kidney disease, stage 3a: Secondary | ICD-10-CM | POA: Diagnosis not present

## 2024-01-30 DIAGNOSIS — N1831 Chronic kidney disease, stage 3a: Secondary | ICD-10-CM | POA: Diagnosis not present

## 2024-01-30 DIAGNOSIS — E7800 Pure hypercholesterolemia, unspecified: Secondary | ICD-10-CM | POA: Diagnosis not present

## 2024-01-30 DIAGNOSIS — Z0000 Encounter for general adult medical examination without abnormal findings: Secondary | ICD-10-CM | POA: Diagnosis not present

## 2024-01-30 DIAGNOSIS — F411 Generalized anxiety disorder: Secondary | ICD-10-CM | POA: Diagnosis not present

## 2024-01-30 DIAGNOSIS — R202 Paresthesia of skin: Secondary | ICD-10-CM | POA: Diagnosis not present

## 2024-01-31 DIAGNOSIS — K080 Exfoliation of teeth due to systemic causes: Secondary | ICD-10-CM | POA: Diagnosis not present

## 2024-02-04 ENCOUNTER — Ambulatory Visit
Admission: RE | Admit: 2024-02-04 | Discharge: 2024-02-04 | Disposition: A | Discharge status: Home / Self Care | Payer: Medicare Other | Source: Ambulatory Visit | Attending: Family Medicine | Admitting: Family Medicine

## 2024-02-04 DIAGNOSIS — Z1231 Encounter for screening mammogram for malignant neoplasm of breast: Secondary | ICD-10-CM | POA: Diagnosis not present

## 2024-02-04 DIAGNOSIS — Z0000 Encounter for general adult medical examination without abnormal findings: Secondary | ICD-10-CM

## 2024-07-13 DIAGNOSIS — R197 Diarrhea, unspecified: Secondary | ICD-10-CM | POA: Diagnosis not present

## 2024-07-17 DIAGNOSIS — N3000 Acute cystitis without hematuria: Secondary | ICD-10-CM | POA: Diagnosis not present

## 2024-07-17 DIAGNOSIS — M545 Low back pain, unspecified: Secondary | ICD-10-CM | POA: Diagnosis not present

## 2024-07-17 DIAGNOSIS — R197 Diarrhea, unspecified: Secondary | ICD-10-CM | POA: Diagnosis not present

## 2024-07-27 DIAGNOSIS — F419 Anxiety disorder, unspecified: Secondary | ICD-10-CM | POA: Diagnosis not present

## 2024-07-27 DIAGNOSIS — R197 Diarrhea, unspecified: Secondary | ICD-10-CM | POA: Diagnosis not present

## 2024-07-30 DIAGNOSIS — H35371 Puckering of macula, right eye: Secondary | ICD-10-CM | POA: Diagnosis not present

## 2024-07-30 DIAGNOSIS — H318 Other specified disorders of choroid: Secondary | ICD-10-CM | POA: Diagnosis not present

## 2024-08-13 DIAGNOSIS — K080 Exfoliation of teeth due to systemic causes: Secondary | ICD-10-CM | POA: Diagnosis not present

## 2024-09-28 DIAGNOSIS — N1831 Chronic kidney disease, stage 3a: Secondary | ICD-10-CM | POA: Diagnosis not present

## 2024-09-28 DIAGNOSIS — E7800 Pure hypercholesterolemia, unspecified: Secondary | ICD-10-CM | POA: Diagnosis not present
